# Patient Record
Sex: Male | Born: 1985 | Race: White | Hispanic: No | Marital: Single | Smoking: Current every day smoker
Health system: Southern US, Community
[De-identification: ages and names within clinical notes are randomized; demographics above are authoritative.]

---

## 2007-12-11 ENCOUNTER — Emergency Department (HOSPITAL_COMMUNITY): Admission: EM | Admit: 2007-12-11 | Discharge: 2007-12-11 | Payer: Self-pay | Admitting: Emergency Medicine

## 2008-07-16 ENCOUNTER — Encounter (INDEPENDENT_AMBULATORY_CARE_PROVIDER_SITE_OTHER): Payer: Self-pay | Admitting: Otolaryngology

## 2008-07-16 ENCOUNTER — Ambulatory Visit (HOSPITAL_BASED_OUTPATIENT_CLINIC_OR_DEPARTMENT_OTHER): Admission: RE | Admit: 2008-07-16 | Discharge: 2008-07-16 | Payer: Self-pay | Admitting: Otolaryngology

## 2010-05-03 HISTORY — PX: OTHER SURGICAL HISTORY: SHX169

## 2010-09-15 NOTE — Op Note (Signed)
[name], [name]             ACCOUNT NO.:  [id number]   MEDICAL RECORD NO.:  [id number]          PATIENT TYPE:  AMB   LOCATION:  DSC                          FACILITY:  [facility]   PHYSICIAN:  Christopher E. Ezzard Standing, M.D.DATE OF BIRTH:  10-23-1985   DATE OF PROCEDURE:  07/16/2008  DATE OF DISCHARGE:                               OPERATIVE REPORT   PREOPERATIVE DIAGNOSIS:  Thyroglossal duct cyst.   POSTOPERATIVE DIAGNOSIS:  Thyroglossal duct cyst.   OPERATION:  Sistrunk procedure (excision of thyroglossal duct cyst).   SURGEON:  Kristine Garbe. Ezzard Standing, MD   ANESTHESIA:  General endotracheal.   COMPLICATIONS:  None.   BRIEF CLINICAL NOTE:  Paul Morris is a 25 year old gentleman who has  had recurrent swelling of a cyst in his neck for the past 4-5 months.  On exam, he has a small 2-cm cyst just left of midline about the level  of the anterior thyroid cartilage just below the hyoid bone.  It is  adherent somewhat to the skin as there is some slight inflammation  around the cyst.  Although it is a left of midline, on history and  clinical exam is consistent with probable thyroglossal duct cyst.  He is  taken to operating room at this time for excision of thyroglossal duct  cyst with probable Sistrunk procedure.   DESCRIPTION OF PROCEDURE:  After adequate endotracheal anesthesia, the  patient received 1 gram Ancef IV preoperatively.  Neck was prepped with  Betadine solution and draped out with sterile towels.  Because of the  cyst being adherent to the skin, some of the overlying skin was excised  elliptically with the cyst and the incision was carried more medial  across midline just above the laryngeal cartilage notch.  The incision  was made through the skin down to subcutaneous tissue.  The cyst was  very sclerotic, was dissected out, and there was a small tract coursing  more medial toward the hyoid bone and this was followed out to the  central portion of the hyoid bone.   The hyoid bone was dissected out and  the central portion of the hyoid bone was resected with bone cutters.  A  right angle was then used to the clamp the remaining fistula tract deep  to the hyoid bone and the central hyoid bone and cyst in the fistula  tract was resected and sent to pathology.  A 3-0 suture ligature was  placed around the right angle in addition to another 3-0 silk suture  ligature over the remaining fistula tract.  Hemostasis was obtained with  cautery.  Wound was irrigated with saline and then closed with 3-0  chromic sutures subcutaneously and 5-0 nylon reapproximating the skin  edges.  A 1/4-inch Penrose drain was brought out centrally to drain the  deep excision portion around the hyoid bone.  Bacitracin ointment and  sterile dressing was applied.  Naithan was awoken from anesthesia,  transferred to recovery room, and postop doing well.   DISPOSITION:  Kylian was discharged home later this morning on Keflex 5  mg b.i.d. for 5 days, Tylenol and Vicodin p.r.n.  pain.  I will have him  follow up in my office tomorrow to have his Penrose drain removed and in  1 week to have sutures removed.           ______________________________  Kristine Garbe Ezzard Standing, M.D.     CEN/MEDQ  D:  07/16/2008  T:  07/16/2008  Job:  [id number]

## 2011-01-29 LAB — CBC
Hemoglobin: 16.1
MCHC: 34.5
RBC: 4.9
WBC: 11.9 — ABNORMAL HIGH

## 2011-01-29 LAB — DIFFERENTIAL
Basophils Relative: 0
Lymphocytes Relative: 22
Lymphs Abs: 2.7
Monocytes Absolute: 1
Monocytes Relative: 9
Neutro Abs: 7.8 — ABNORMAL HIGH

## 2011-01-29 LAB — POCT RAPID STREP A: Streptococcus, Group A Screen (Direct): NEGATIVE

## 2015-06-16 ENCOUNTER — Emergency Department (HOSPITAL_COMMUNITY): Payer: Self-pay

## 2015-06-16 ENCOUNTER — Encounter (HOSPITAL_COMMUNITY): Payer: Self-pay | Admitting: Emergency Medicine

## 2015-06-16 ENCOUNTER — Emergency Department (HOSPITAL_COMMUNITY)
Admission: EM | Admit: 2015-06-16 | Discharge: 2015-06-17 | Disposition: A | Discharge status: Home / Self Care | Payer: Self-pay | Attending: Emergency Medicine | Admitting: Emergency Medicine

## 2015-06-16 DIAGNOSIS — Z880 Allergy status to penicillin: Secondary | ICD-10-CM | POA: Insufficient documentation

## 2015-06-16 DIAGNOSIS — F1010 Alcohol abuse, uncomplicated: Secondary | ICD-10-CM | POA: Insufficient documentation

## 2015-06-16 DIAGNOSIS — F17210 Nicotine dependence, cigarettes, uncomplicated: Secondary | ICD-10-CM | POA: Insufficient documentation

## 2015-06-16 DIAGNOSIS — F329 Major depressive disorder, single episode, unspecified: Secondary | ICD-10-CM | POA: Insufficient documentation

## 2015-06-16 DIAGNOSIS — R443 Hallucinations, unspecified: Secondary | ICD-10-CM | POA: Insufficient documentation

## 2015-06-16 LAB — COMPREHENSIVE METABOLIC PANEL
ALBUMIN: 4.8 g/dL (ref 3.5–5.0)
ALK PHOS: 63 U/L (ref 38–126)
ALT: 20 U/L (ref 17–63)
AST: 31 U/L (ref 15–41)
Anion gap: 12 (ref 5–15)
BILIRUBIN TOTAL: 1 mg/dL (ref 0.3–1.2)
BUN: 13 mg/dL (ref 6–20)
CALCIUM: 9.7 mg/dL (ref 8.9–10.3)
CO2: 26 mmol/L (ref 22–32)
Chloride: 100 mmol/L — ABNORMAL LOW (ref 101–111)
Creatinine, Ser: 0.98 mg/dL (ref 0.61–1.24)
GFR calc Af Amer: >60 mL/min (ref >60)
GFR calc non Af Amer: >60 mL/min (ref >60)
GLUCOSE: 100 mg/dL — AB (ref 65–99)
Potassium: 3.6 mmol/L (ref 3.5–5.1)
Sodium: 138 mmol/L (ref 135–145)
TOTAL PROTEIN: 7.8 g/dL (ref 6.5–8.1)

## 2015-06-16 LAB — CBC
HEMATOCRIT: 42.6 % (ref 39.0–52.0)
Hemoglobin: 15.2 g/dL (ref 13.0–17.0)
MCH: 33.6 pg (ref 26.0–34.0)
MCHC: 35.7 g/dL (ref 30.0–36.0)
MCV: 94.2 fL (ref 78.0–100.0)
Platelets: 210 10*3/uL (ref 150–400)
RBC: 4.52 MIL/uL (ref 4.22–5.81)
RDW: 12.8 % (ref 11.5–15.5)
WBC: 13 10*3/uL — ABNORMAL HIGH (ref 4.0–10.5)

## 2015-06-16 LAB — SALICYLATE LEVEL: Salicylate Lvl: <4 mg/dL (ref 2.8–30.0)

## 2015-06-16 LAB — ETHANOL: Alcohol, Ethyl (B): <5 mg/dL (ref <5)

## 2015-06-16 LAB — ACETAMINOPHEN LEVEL: Acetaminophen (Tylenol), Serum: <10 ug/mL — ABNORMAL LOW (ref 10–30)

## 2015-06-16 MED ORDER — THIAMINE HCL 100 MG/ML IJ SOLN: 100.0000 mg | Freq: Every day | INTRAMUSCULAR | Status: DC

## 2015-06-16 MED ORDER — LORAZEPAM 1 MG PO TABS: 0.0000 mg | ORAL_TABLET | Freq: Two times a day (BID) | ORAL | Status: DC

## 2015-06-16 MED ORDER — LORAZEPAM 1 MG PO TABS
0.0000 mg | ORAL_TABLET | Freq: Four times a day (QID) | ORAL | Status: DC
Start: 2015-06-17 — End: 2015-06-17

## 2015-06-16 MED ORDER — VITAMIN B-1 100 MG PO TABS: 100.0000 mg | ORAL_TABLET | Freq: Every day | ORAL | Status: DC

## 2015-06-16 MED ORDER — IBUPROFEN 200 MG PO TABS: 600.0000 mg | ORAL_TABLET | Freq: Three times a day (TID) | ORAL | Status: DC | PRN

## 2015-06-16 MED ORDER — ONDANSETRON HCL 4 MG PO TABS: 4.0000 mg | ORAL_TABLET | Freq: Three times a day (TID) | ORAL | Status: DC | PRN

## 2015-06-16 MED ORDER — ACETAMINOPHEN 325 MG PO TABS: 650.0000 mg | ORAL_TABLET | ORAL | Status: DC | PRN

## 2015-06-16 NOTE — BHH Counselor (Signed)
Spoke with patient to provide him with outpatient resources for substance abuse and mental health. This writer instructed the patient to go to [facility] in the morning during walk-in times to have an assessment. Patient was also encouraged to walk-in to [facility] for an assessment for substance abuse treatment once seen at [facility]. Patient states that he feels safe discharging and will follow-up in the morning. Patient asked if he needed to have insurance and was informed that no insurance was needed for those places. Patient denies any other questions or concerns at this time.     Davina Poke, LCSW Therapeutic Triage Specialist [facility] 06/16/2015 11:20 PM

## 2015-06-16 NOTE — Discharge Instructions (Addendum)
Patient discharged with outpatient substance abuse resources to follow up upon discharge: Intensive Outpatient Programs [facility]    [facility] [address]     [address] [place],  [place]     [place], [place] [phone]      [phone] Both a day and evening program   *Accepts Medicaid  [facility]  [facility] Substance abuse treatment ctr [address]                 [address] [place], [place] [address] [phone]                  [phone]  [facility]    [facility] - Intensive Outpatient [address]                 [address] [place], [place] [address]                 [place], [place]  [phone]                  [phone] [address] [place], [place] [address] [phone] *Accepts MCD      Residential Treatment Programs ASAP Residential Treatment    [facility] ([facility] [address]     [address] [place], [place] [phone]      [phone] or [phone]  [facility]     The [address] (Several in [place]) [address], [place] [address]    [address] [place] [address]     [place], [place] [phone]      [phone]  [facility]   [facility] ([facility] [address]     [address] [place], [place] [address]                  [place], [place] [phone]                   [phone] Admissions: 8am-3pm M-F  Self-Help/Support Groups [facility]   Narcotics Anonymous (NA) Variety of support groups    [facility] [phone] (call for more info)    [address] [place] [address]        [place] - 2 meetings at this location

## 2015-06-16 NOTE — BH Assessment (Signed)
Assessment completed.  Consulted with Donell Sievert, PA-C who states that patient does not meet inpatient criteria and will be discharged with outpatient resources for substance abuse.   Davina Poke, LCSW Therapeutic Triage Specialist [facility] 06/16/2015 11:04 PM

## 2015-06-16 NOTE — ED Notes (Signed)
Pt c/o auditory and visual hallucinations x several years, worse over past few weeks. Hallucinations involve people who converse with him. Denies SI/HI. Pt drinks alcohol heavily, drinks as much as a fifth of liquor in a day, last drink yesterday. Denies any withdrawal sx when he goes without drinking, states he regularly goes stretches of time without drinking, including a period in the past 6 months, and did not have difficulty.
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
## 2015-11-11 ENCOUNTER — Encounter (INDEPENDENT_AMBULATORY_CARE_PROVIDER_SITE_OTHER): Payer: Self-pay

## 2015-11-11 DIAGNOSIS — J029 Acute pharyngitis, unspecified: Secondary | ICD-10-CM

## 2015-11-11 DIAGNOSIS — J069 Acute upper respiratory infection, unspecified: Secondary | ICD-10-CM

## 2016-08-03 NOTE — Progress Notes (Signed)
Subjective:   Patient ID: Paul Morris    DOB: 1986/03/04, 31 y.o. male   MRN: [id number]  CC: "bump on my neck"  HPI: Paul Morris is a 31 y.o. male who presents to clinic today to establish as a new patient. Problems discussed today are as follows:  Neck cyst: Onset 8 months ago. Gradual onset localized above thyroid. Denies pain or difficulty swallowing, hoarse voice. 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  Alcohol use disorder: Sober 2 months. Issue for 7 years. Says he used to hear voices telling him to drink which he says is from his schizophrenia. Now is remission making efforts to avoid situations that would put him at risk or relapse. Not seeing therapist or support group. Open to seeing AA.  Schizophrenia: Diagnosed and managed with medication by Paul Morris. Says he used to have voices telling him to drink. Intermittently hears voices but not at present. Says attacks feel as if he has a helmet on that blinds him and tells him what to do. Sees Paul Morris PMHNP every 6 weeks.  Smoking: Has 12 pack year history. Smoking 3/4 ppd. Interested in stopping. No previous attempts to stop.  ROS: complete ROS performed, see HPI for pertinent ROS.  PMFSH: Schizophrenia, PTSD, alcohol use disorder, tobacco use disorder, anxity, depression, s/p excision of thyroglossal duct cyst. Smoking status reviewed. Medications reviewed.  Objective:   BP 112/70   Pulse 74   Temp 98.3 F (36.8 C) (Oral)   Ht  (1.88 m)   Wt 183 lb (83 kg)   SpO2 98%   BMI 23.50 kg/m  Vitals and nursing note reviewed.  General: well nourished, well developed, in no acute distress with non-toxic appearance HEENT: normocephalic, atraumatic, moist mucous membranes, uvula midline without tonsillar edema Neck: supple, non-tender anterior centralized firm mobile subcutaneous mass measuring 1.5 cm that does not move upon swallowing CV: regular rate and rhythm without murmurs, rubs,  or gallops, no lower extremity edema Lungs: clear to auscultation bilaterally with normal work of breathing Abdomen: soft, non-tender, non-distended, no masses or organomegaly palpable, normoactive bowel sounds Skin: warm, dry, no rashes or lesions, cap refill < 2 seconds Extremities: warm and well perfused, normal tone  Assessment & Plan:   Cyst of neck Subacute. Has h/o thyroglossal ductal cyst correction. No findings concerning for malignancy.  --Will refer to ENT for further evaluation --Holding on TSH, [place], or further imaging at this point  Schizophrenia (HCC) Chronic. Controlled on Invega. Managed by Paul Morris. No auditory hallucinations at present. Neg depression screen. --Paul Morris monthly --Continue Invega 6 mg QD, Celexa 40 mg QD, Buspar 5 mg TID --Educated on importance of remaining sober --Discussed warning signs to seek emergent medical attention  Tobacco use disorder Chronic. Wants to quit. Has 12 pack years. No prior attempts to quit. --Given 1-800-QUIT-NOW hotline --Instructed to return in 1 month for further discussion --Consider referral to Dr. Raymondo Morris  Alcohol use disorder (HCC) Remission. Sober 2 months. Making efforts to avoid EtOH. No other substance abuse per pt. --Discussed reasons to seek help --Encouraged seeking AA  Orders Placed This Encounter  Procedures  . Ambulatory referral to ENT    Referral Priority:   Elective    Referral Type:   Consultation    Referral Reason:   Specialty Services Required    Requested Specialty:   Otolaryngology    Number of Visits Requested:   1   Meds ordered this encounter  Medications  .  busPIRone (BUSPAR) 5 MG tablet    Sig: Take 5 mg by mouth 3 (three) times daily.  . citalopram (CELEXA) 40 MG tablet    Sig: Take 40 mg by mouth daily.  . paliperidone (INVEGA) 6 MG 24 hr tablet    Sig: Take 6 mg by mouth daily.    Paul Parcel, DO [facility], PGY-1 08/04/2016 6:14 PM

## 2016-08-04 ENCOUNTER — Encounter: Payer: Self-pay | Admitting: Family Medicine

## 2016-08-04 ENCOUNTER — Ambulatory Visit (INDEPENDENT_AMBULATORY_CARE_PROVIDER_SITE_OTHER): Payer: Medicaid Other | Admitting: Family Medicine

## 2016-08-04 VITALS — BP 112/70 | HR 74 | Temp 98.3°F | Ht 74.0 in | Wt 183.0 lb

## 2016-08-04 DIAGNOSIS — F203 Undifferentiated schizophrenia: Secondary | ICD-10-CM

## 2016-08-04 DIAGNOSIS — F32A Depression, unspecified: Secondary | ICD-10-CM | POA: Insufficient documentation

## 2016-08-04 DIAGNOSIS — F17200 Nicotine dependence, unspecified, uncomplicated: Secondary | ICD-10-CM

## 2016-08-04 DIAGNOSIS — R590 Localized enlarged lymph nodes: Secondary | ICD-10-CM | POA: Insufficient documentation

## 2016-08-04 DIAGNOSIS — Z9889 Other specified postprocedural states: Secondary | ICD-10-CM | POA: Diagnosis not present

## 2016-08-04 DIAGNOSIS — IMO0002 Reserved for concepts with insufficient information to code with codable children: Secondary | ICD-10-CM

## 2016-08-04 DIAGNOSIS — F209 Schizophrenia, unspecified: Secondary | ICD-10-CM | POA: Insufficient documentation

## 2016-08-04 DIAGNOSIS — F1020 Alcohol dependence, uncomplicated: Secondary | ICD-10-CM | POA: Insufficient documentation

## 2016-08-04 DIAGNOSIS — Z87798 Personal history of other (corrected) congenital malformations: Secondary | ICD-10-CM | POA: Diagnosis not present

## 2016-08-04 DIAGNOSIS — F1099 Alcohol use, unspecified with unspecified alcohol-induced disorder: Secondary | ICD-10-CM

## 2016-08-04 DIAGNOSIS — F329 Major depressive disorder, single episode, unspecified: Secondary | ICD-10-CM | POA: Insufficient documentation

## 2016-08-04 DIAGNOSIS — F411 Generalized anxiety disorder: Secondary | ICD-10-CM | POA: Insufficient documentation

## 2016-08-04 DIAGNOSIS — L723 Sebaceous cyst: Secondary | ICD-10-CM

## 2016-08-04 NOTE — Assessment & Plan Note (Signed)
Remission. Sober 2 months. Making efforts to avoid EtOH. No other substance abuse per pt. --Discussed reasons to seek help --Encouraged seeking AA

## 2016-08-04 NOTE — Assessment & Plan Note (Signed)
Chronic. Wants to quit. Has 12 pack years. No prior attempts to quit. --Given 1-800-QUIT-NOW hotline --Instructed to return in 1 month for further discussion --Consider referral to Dr. Raymondo Band

## 2016-08-04 NOTE — Assessment & Plan Note (Addendum)
Chronic. Controlled on Invega. Managed by [employer]. No auditory hallucinations at present. Neg depression screen. --[facility] monthly --Continue Invega 6 mg QD, Celexa 40 mg QD, Buspar 5 mg TID --Educated on importance of remaining sober --Discussed warning signs to seek emergent medical attention

## 2016-08-04 NOTE — Patient Instructions (Signed)
Thank you for coming in to see [place] today. Please see below to review our plan for today's visit.  1. I sent a referral in for ENT to evaluate your neck mass. They will contact you to schedule an appointment. I suspect this is a benign cause. 2. Continue [facility] for your medications and follow-up. If you relapse with alcohol abuse, please see medical attention. Consider alcoholic anonymous as this can be very beneficial. 3. I will discuss with you smoking cessation at her next visit. In the meantime he will call 1-800-quit-now. They will go through a series of questions over the course of about 15 minutes and provide you free resources to help stop smoking. 4. Return to clinic in one month.  Please call the clinic at [phone] if your symptoms worsen or you have any concerns. It was my pleasure to see you. -- [name], DO [facility], PGY-1

## 2016-08-04 NOTE — Assessment & Plan Note (Addendum)
Subacute. Has h/o thyroglossal ductal cyst correction. No findings concerning for malignancy.  --Will refer to ENT for further evaluation --Holding on TSH, [place], or further imaging at this point

## 2016-09-03 ENCOUNTER — Other Ambulatory Visit: Payer: Self-pay | Admitting: Otolaryngology

## 2016-09-03 DIAGNOSIS — Q892 Congenital malformations of other endocrine glands: Secondary | ICD-10-CM

## 2016-09-10 ENCOUNTER — Ambulatory Visit
Admission: RE | Admit: 2016-09-10 | Discharge: 2016-09-10 | Disposition: A | Discharge status: Home / Self Care | Payer: Medicaid Other | Source: Ambulatory Visit | Attending: Otolaryngology | Admitting: Otolaryngology

## 2016-09-10 DIAGNOSIS — Q892 Congenital malformations of other endocrine glands: Secondary | ICD-10-CM

## 2016-09-24 ENCOUNTER — Other Ambulatory Visit: Payer: Self-pay | Admitting: Otolaryngology

## 2016-09-28 ENCOUNTER — Encounter: Payer: Self-pay | Admitting: Family Medicine

## 2016-11-09 NOTE — Progress Notes (Signed)
   Subjective:   Patient ID: Paul Morris    DOB: [birth year], 31 y.o. male   MRN: [id number]   CC: "Abdominal pain"  HPI: Paul Morris is a 31 y.o. male who presents to clinic today for abdominal pain. Problems discussed today are as follows:  Abdominal pain: Patient complaining of left upper quadrant abdominal pain since relapse with alcohol abuse with a 3 day binge last week. Patient states he is also been experiencing vomiting with specks of blood. He has attempted to relieve his symptoms with water without success. Abdominal pain seems to be unaffected by food. ROS: Denies fevers or chills, nausea, chest pain, dyspnea, constipation or diarrhea, melena or hematochezia.  EtOH abuse: Recent relapse with 3 day binge. Patient drinking excessive amounts of beer only. Seems to be triggered when exposed to social situations with drinking, particularly his father who lives separated from his mother. [facility]. Next group meeting tomorrow. ROS: Denies visual hallucinations, tremors, palpitations, diaphoresis.  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 Has been followed by ENT since surgery. Recently rescheduled follow-up visit missed yesterday. ROS: Denies dysphagia, edema, fevers or chills.  Complete ROS performed, see HPI for pertinent.  PMFSH: Schizophrenia, PTSD, alcohol use disorder, tobacco use disorder, anxity, depression, s/p excision of thyroglossal duct cyst with revision. Smoking status reviewed. Medications reviewed.  Objective:   BP 128/62   Pulse 83   Temp 98.4 F (36.9 C) (Oral)   Ht 6\' 2"  (1.88 m)   Wt 179 lb (81.2 kg)   SpO2 96%   BMI 22.98 kg/m  Vitals and nursing note reviewed.  General: well nourished, well developed, in no acute distress with non-toxic appearance HEENT: normocephalic, atraumatic, moist mucous membranes Neck: supple, non-tender without lymphadenopathy, central  anterior well-healed scar on neck CV: regular rate and rhythm without murmurs, rubs, or gallops, no lower extremity edema Lungs: clear to auscultation bilaterally with normal work of breathing Abdomen: soft, minimally tender at LUQ without rebound or gaurding, non-distended, no masses or organomegaly palpable, normoactive bowel sounds Skin: warm, dry, no rashes or lesions, cap refill < 2 seconds Extremities: warm and well perfused, normal tone Psych: euthymic mood congruent affect  Assessment & Plan:   History of thyroglossal duct cyst removal Chronic. Resolved s/p revision correction.  --Pt instructed to f/u with ENT  Alcohol use disorder (HCC) Chornic. Recently in relapse as of this week. Triggered when pt around social situations with EtOH, particularly his father. Pt shows good understanding of situation and what he needs to do. --Continue follow-up with [facility] and Invega 6 mg daily, Buspar 5 mg daily, Celexa 40 mg daily  Abdominal pain, left upper quadrant Acute. Pt endorses EtOH binge which is likely the source. POCT H. Pylori negative. No signs of acute abdomen. --Given trial with Protonix 40 mg daily --Discussed importance of remaining sober  Orders Placed This Encounter  Procedures  . H. pylori Screen   Meds ordered this encounter  Medications  . pantoprazole (PROTONIX) 40 MG tablet    Sig: Take 1 tablet (40 mg total) by mouth daily.    Dispense:  30 tablet    Refill:  0    Durward Parcelavid McMullen, DO [facility], PGY-2 11/10/2016 8:29 PM

## 2016-11-10 ENCOUNTER — Encounter: Payer: Self-pay | Admitting: Family Medicine

## 2016-11-10 ENCOUNTER — Ambulatory Visit (INDEPENDENT_AMBULATORY_CARE_PROVIDER_SITE_OTHER): Payer: Medicaid Other | Admitting: Family Medicine

## 2016-11-10 VITALS — BP 128/62 | HR 83 | Temp 98.4°F | Ht 74.0 in | Wt 179.0 lb

## 2016-11-10 DIAGNOSIS — R1012 Left upper quadrant pain: Secondary | ICD-10-CM

## 2016-11-10 DIAGNOSIS — Z9889 Other specified postprocedural states: Secondary | ICD-10-CM | POA: Diagnosis not present

## 2016-11-10 DIAGNOSIS — K219 Gastro-esophageal reflux disease without esophagitis: Secondary | ICD-10-CM | POA: Insufficient documentation

## 2016-11-10 DIAGNOSIS — R1010 Upper abdominal pain, unspecified: Secondary | ICD-10-CM

## 2016-11-10 DIAGNOSIS — F1099 Alcohol use, unspecified with unspecified alcohol-induced disorder: Secondary | ICD-10-CM

## 2016-11-10 DIAGNOSIS — IMO0002 Reserved for concepts with insufficient information to code with codable children: Secondary | ICD-10-CM

## 2016-11-10 DIAGNOSIS — Z87798 Personal history of other (corrected) congenital malformations: Secondary | ICD-10-CM | POA: Diagnosis not present

## 2016-11-10 LAB — POCT H PYLORI SCREEN: H PYLORI SCREEN, POC: NEGATIVE

## 2016-11-10 MED ORDER — PANTOPRAZOLE SODIUM 40 MG PO TBEC: 40.0000 mg | DELAYED_RELEASE_TABLET | Freq: Every day | ORAL | 0 refills | Status: DC

## 2016-11-10 NOTE — Assessment & Plan Note (Addendum)
Chornic. Recently in relapse as of this week. Triggered when pt around social situations with EtOH, particularly his father. Pt shows good understanding of situation and what he needs to do. --Continue follow-up with [facility] and Invega 6 mg daily, Buspar 5 mg daily, Celexa 40 mg daily

## 2016-11-10 NOTE — Patient Instructions (Addendum)
Thank you for coming in to see [place] today. Please see below to review our plan for today's visit.  1. I have given you a prescription for Protonix. See below for information on this medication. He likely have irritation of the stomach from your alcohol abuse. Ultimately this will not resolve if you continue to drink. Please continue following your group therapy sessions and abstain from social situations where alcohol is involved. 2. Follow-up with ENT for your surgical follow-up.  Return to clinic in 2 weeks to discuss your abdominal pain.  Please call the clinic at [phone] if your symptoms worsen or you have any concerns. It was my pleasure to see you. -- [name], DO [facility], PGY-2  Pantoprazole tablets What is this medicine? PANTOPRAZOLE (pan TOE pra zole) prevents the production of acid in the stomach. It is used to treat gastroesophageal reflux disease (GERD), inflammation of the esophagus, and Zollinger-Ellison syndrome. This medicine may be used for other purposes; ask your health care provider or pharmacist if you have questions. COMMON BRAND NAME(S): Protonix What should I tell my health care provider before I take this medicine? They need to know if you have any of these conditions: -liver disease -low levels of magnesium in the blood -lupus -an unusual or allergic reaction to omeprazole, lansoprazole, pantoprazole, rabeprazole, other medicines, foods, dyes, or preservatives -pregnant or trying to get pregnant -breast-feeding How should I use this medicine? Take this medicine by mouth. Swallow the tablets whole with a drink of water. Follow the directions on the prescription label. Do not crush, break, or chew. Take your medicine at regular intervals. Do not take your medicine more often than directed. Talk to your pediatrician regarding the use of this medicine in children. While this drug may be prescribed for children as young as 5 years for  selected conditions, precautions do apply. Overdosage: If you think you have taken too much of this medicine contact a poison control center or emergency room at once. NOTE: This medicine is only for you. Do not share this medicine with others. What if I miss a dose? If you miss a dose, take it as soon as you can. If it is almost time for your next dose, take only that dose. Do not take double or extra doses. What may interact with this medicine? Do not take this medicine with any of the following medications: -atazanavir -nelfinavir This medicine may also interact with the following medications: -ampicillin -delavirdine -erlotinib -iron salts -medicines for fungal infections like ketoconazole, itraconazole and voriconazole -methotrexate -mycophenolate mofetil -warfarin This list may not describe all possible interactions. Give your health care provider a list of all the medicines, herbs, non-prescription drugs, or dietary supplements you use. Also tell them if you smoke, drink alcohol, or use illegal drugs. Some items may interact with your medicine. What should I watch for while using this medicine? It can take several days before your stomach pain gets better. Check with your doctor or health care professional if your condition does not start to get better, or if it gets worse. You may need blood work done while you are taking this medicine. What side effects may I notice from receiving this medicine? Side effects that you should report to your doctor or health care professional as soon as possible: -allergic reactions like skin rash, itching or hives, swelling of the face, lips, or tongue -bone, muscle or joint pain -breathing problems -chest pain or chest tightness -dark yellow or brown  urine -dizziness -fast, irregular heartbeat -feeling faint or lightheaded -fever or sore throat -muscle spasm -palpitations -rash on cheeks or arms that gets worse in the sun -redness,  blistering, peeling or loosening of the skin, including inside the mouth -seizures -tremors -unusual bleeding or bruising -unusually weak or tired -yellowing of the eyes or skin Side effects that usually do not require medical attention (report to your doctor or health care professional if they continue or are bothersome): -constipation -diarrhea -dry mouth -headache -nausea This list may not describe all possible side effects. Call your doctor for medical advice about side effects. You may report side effects to FDA at 1-800-FDA-1088. Where should I keep my medicine? Keep out of the reach of children. Store at room temperature between 15 and 30 degrees C (59 and 86 degrees F). Protect from light and moisture. Throw away any unused medicine after the expiration date. NOTE: This sheet is a summary. It may not cover all possible information. If you have questions about this medicine, talk to your doctor, pharmacist, or health care provider.  2018 Elsevier/Gold Standard (2015-05-22 12:20:19)

## 2016-11-10 NOTE — Assessment & Plan Note (Addendum)
Chronic. Resolved s/p revision correction.  --Pt instructed to f/u with ENT

## 2016-11-10 NOTE — Assessment & Plan Note (Addendum)
Acute. Pt endorses EtOH binge which is likely the source. POCT H. Pylori negative. No signs of acute abdomen. --Given trial with Protonix 40 mg daily --Discussed importance of remaining sober

## 2017-02-03 ENCOUNTER — Ambulatory Visit (INDEPENDENT_AMBULATORY_CARE_PROVIDER_SITE_OTHER): Payer: Medicaid Other | Admitting: Family Medicine

## 2017-02-03 ENCOUNTER — Ambulatory Visit (HOSPITAL_COMMUNITY)
Admission: RE | Admit: 2017-02-03 | Discharge: 2017-02-03 | Disposition: A | Discharge status: Home / Self Care | Payer: Medicaid Other | Source: Ambulatory Visit | Attending: Family Medicine | Admitting: Family Medicine

## 2017-02-03 ENCOUNTER — Encounter: Payer: Self-pay | Admitting: Family Medicine

## 2017-02-03 VITALS — BP 130/64 | HR 84 | Temp 97.9°F | Ht 74.0 in | Wt 182.8 lb

## 2017-02-03 DIAGNOSIS — R002 Palpitations: Secondary | ICD-10-CM | POA: Diagnosis not present

## 2017-02-03 DIAGNOSIS — Z23 Encounter for immunization: Secondary | ICD-10-CM | POA: Diagnosis not present

## 2017-02-03 DIAGNOSIS — K219 Gastro-esophageal reflux disease without esophagitis: Secondary | ICD-10-CM | POA: Diagnosis present

## 2017-02-03 DIAGNOSIS — F17200 Nicotine dependence, unspecified, uncomplicated: Secondary | ICD-10-CM | POA: Diagnosis not present

## 2017-02-03 MED ORDER — PANTOPRAZOLE SODIUM 20 MG PO TBEC: 20.0000 mg | DELAYED_RELEASE_TABLET | Freq: Every day | ORAL | 0 refills | Status: DC

## 2017-02-03 NOTE — Assessment & Plan Note (Addendum)
Chronic. Resolved but never evaluated for. Recently self-discontinued Buspar. Likely secondary to GAD and exacerbated by EtOH use. Resolved due to being sober for 2 weeks. EKG unremarkable today. Followed by [facility] monthly for GAD treatment. --Recommend discussing restarting Buspar with [facility] at next visit --Continue Celexa 40 mg daily and Invega 6 mg daily

## 2017-02-03 NOTE — Patient Instructions (Signed)
Thank you for coming in to see [place] today. Please see below to review our plan for today's visit.  1. Her EKG overall looks normal. He can consider restarting her BuSpar if you would like. I would advise you to discuss this with [facility]. 2. I have given you a refill prescription for Protonix at a lower dose which should be adequate to control reflux if this recurs. This should not be used as a prophylaxis to drinking. Ultimately, remaining sober is the only way to help prevent her abdominal pain from recurring. 3. Linear no near interested in smoking cessation. In the meantime being call 1-800-QUIT-NOW. You can schedule a complete physical exam up front when you leave.  Please call the clinic at [phone] if your symptoms worsen or you have any concerns. It was my pleasure to see you. -- [name], DO [facility], PGY-2

## 2017-02-03 NOTE — Assessment & Plan Note (Addendum)
Chronic. Current every day smoker. No history of cessation attempts. Not interested today. --Given contact information for 1-800-QUIT-NOW

## 2017-02-03 NOTE — Progress Notes (Signed)
Subjective:   Patient ID: Paul Morris    DOB: 10-Jul-1985, 31 y.o. male   MRN: [id number]  CC: "Abdominal pain"  HPI: Paul Morris is a 30 y.o. male who presents to clinic today for abdominal pain. Problems discussed today are as follows:  Abdominal pain: Patient started Protonix prescribed with significant relief after a few days. Patient then had a relapse in alcohol use because his symptoms had resolved. He did not continue the Protonix at this time but began having recurrent abdominal pain. He states "it feels like something is sloshing around in there." He has been sober for approximately 2 weeks with resolution of symptoms. He does not have any more of his Protonix. ROS: Denies nausea or vomiting, hemoptysis, melena or hematochezia, diarrhea constipation.  Palpitations: Was experiencing almost daily proximally one month ago. Patient states he does not have a history of these feelings but is treated with BuSpar for his anxiety. He states his doctor at [facility] told him he should get an EKG. Patient discontinued the medication taking it was causing the palpitations with resolution of symptoms. He states his anxiety is unchanged since discontinuing BuSpar. ROS: Denies feelings of syncope, chest pain, shortness of breath, headaches.  Smoking: Continues to smoke 1 pack per day. Denies any previous history of attempts to stop. States his mother who he lives with his against him trying Chantix. Patient states this is because she is a [employer] and thinks the medication is "addicting." Patient is in disagreement with his mother but is not interested in cessation today. ROS: Denies fevers or chills, cough, chest pain, shortness of breath, change in weight.  Complete ROS performed, see HPI for pertinent.  PMFSH: Schizophrenia, PTSD, alcohol use disorder, tobacco use disorder, anxity, depression, s/p excision of thyroglossal duct cyst with revision. Smoking status reviewed. Medications  reviewed.  Objective:   BP 130/64   Pulse 84   Temp 97.9 F (36.6 C) (Oral)   Ht  (1.88 m)   Wt 182 lb 12.8 oz (82.9 kg)   SpO2 97%   BMI 23.47 kg/m  Vitals and nursing note reviewed.  General: well nourished, well developed, in no acute distress with non-toxic appearance HEENT: normocephalic, atraumatic, moist mucous membranes Neck: supple, non-tender without lymphadenopathy CV: regular rate and rhythm without murmurs, rubs, or gallops, no lower extremity edema Lungs: clear to auscultation bilaterally with normal work of breathing Abdomen: soft, non-tender, non-distended, no masses or organomegaly palpable, normoactive bowel sounds Skin: warm, dry, no rashes or lesions, cap refill < 2 seconds Extremities: warm and well perfused, normal tone  Assessment & Plan:   Palpitations Chronic. Resolved but never evaluated for. Recently self-discontinued Buspar. Likely secondary to GAD and exacerbated by EtOH use. Resolved due to being sober for 2 weeks. EKG unremarkable today. Followed by [facility] monthly for GAD treatment. --Recommend discussing restarting Buspar with [facility] at next visit --Continue Celexa 40 mg daily and Invega 6 mg daily  Tobacco use disorder Chronic. Current every day smoker. No history of cessation attempts. Not interested today. --Given contact information for 1-800-QUIT-NOW  GERD (gastroesophageal reflux disease) Chronic. Stable. Seems to be related to EtOH abuse. Currently sober for 2 weeks. No red flags. --Discussed importance of EtOH avoidance --Given refill for Protonix 20 mg daily with strict instructions not to take as prophylaxis with EtOH  Orders Placed This Encounter  Procedures  . Flu Vaccine QUAD 36+ mos IM  . EKG 12-Lead   Meds ordered this encounter  Medications  .  pantoprazole (PROTONIX) 20 MG tablet    Sig: Take 1 tablet (20 mg total) by mouth daily.    Dispense:  30 tablet    Refill:  0    [name], DO [facility], PGY-2 02/04/2017 10:29 AM

## 2017-02-03 NOTE — Assessment & Plan Note (Addendum)
Chronic. Stable. Seems to be related to EtOH abuse. Currently sober for 2 weeks. No red flags. --Discussed importance of EtOH avoidance --Given refill for Protonix 20 mg daily with strict instructions not to take as prophylaxis with EtOH

## 2017-03-22 ENCOUNTER — Encounter: Payer: Self-pay | Admitting: Family Medicine

## 2017-03-22 ENCOUNTER — Ambulatory Visit: Payer: Medicaid Other | Admitting: Family Medicine

## 2017-03-22 ENCOUNTER — Other Ambulatory Visit: Payer: Self-pay

## 2017-03-22 VITALS — BP 100/62 | HR 74 | Temp 97.7°F | Ht 74.0 in | Wt 187.0 lb

## 2017-03-22 DIAGNOSIS — F411 Generalized anxiety disorder: Secondary | ICD-10-CM | POA: Diagnosis not present

## 2017-03-22 DIAGNOSIS — K122 Cellulitis and abscess of mouth: Secondary | ICD-10-CM | POA: Diagnosis present

## 2017-03-22 DIAGNOSIS — F17200 Nicotine dependence, unspecified, uncomplicated: Secondary | ICD-10-CM

## 2017-03-22 NOTE — Assessment & Plan Note (Addendum)
Acute.  Likely related to trauma by finger while manipulating tonsil stones.  No signs of secondary bacterial infection.  No red flags.  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 - Provided patient with ENT contact information and requested he notify them of his situation - Discussed precautions when to seek emergent medical care - Recommended OTC NSAIDs, frequent hydration with water, and brushing teeth for 2 minutes twice daily, and inserting fingers into pharynx - RTC 2 weeks

## 2017-03-22 NOTE — Assessment & Plan Note (Addendum)
Chronic.  Improving without use of BuSpar.  [facility]. - Requested patient schedule follow-up with [facility] for anxiety - Recommended patient restart BuSpar if symptoms seem to worsen

## 2017-03-22 NOTE — Progress Notes (Signed)
   Subjective   Patient ID: Paul Morris    DOB: [birth year], 31 y.o. male   MRN: [id number]  CC: "Bump in throat"  HPI: Paul Morris is a 31 y.o. male who presents to clinic today for the following:  Swollen uvula: Onset 1 month ago after patient dislodged several tonsil stones manually.  Patient began to gradually experience and on sensation in his throat especially when eating.  He denies history of previous sensation in throat.  He denies he was or chills, dysphagia with solids or liquids, nausea or vomiting, shortness of breath, hoarseness, cough, sore throat.  He has remained sober for the past 4 weeks.  He does feel like he may have aggravated his uvula while removing the stones.  He continues to smoke.  He has tried vinegar gargles to help with the tonsil stones but is otherwise not tried additional medications.  Anxiety: Patient has not restarted BuSpar as instructed since last visit.  He feels that his anxiety is improving despite not taking his medication.  Patient states he should be scheduling a follow-up appointment [facility] soon.  Smoking: Patient continues to smoke approximately 3/4 packs/day.  He is not currently interested in cessation at this time.  ROS: see HPI for pertinent.  PMFSH: Schizophrenia, PTSD, alcohol use disorder, tobacco use disorder, anxity, depression.  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  Family history polysubstance abuse.  Smoking status reviewed. Medications reviewed.  Objective   BP 100/62   Pulse 74   Temp 97.7 F (36.5 C) (Oral)   Ht 6\' 2"  (1.88 m)   Wt 187 lb (84.8 kg)   SpO2 97%   BMI 24.01 kg/m  Vitals and nursing note reviewed.  General: well nourished, well developed, NAD with non-toxic appearance HEENT: normocephalic, atraumatic, moist mucous membranes, uvula midline with slight erythema and associated distal irregular enlargement without purulence, tonsils nonedematous bilaterally without  purulence, erythema, or stones Neck: supple, non-tender without lymphadenopathy Cardiovascular: regular rate and rhythm without murmurs, rubs, or gallops Lungs: clear to auscultation bilaterally with normal work of breathing  Assessment & Plan   Uvulitis Acute.  Likely related to trauma by finger while manipulating tonsil stones.  No signs of secondary bacterial infection.  No red flags.  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 - Provided patient with ENT contact information and requested he notify them of his situation - Discussed precautions when to seek emergent medical care - Recommended OTC NSAIDs, frequent hydration with water, and brushing teeth for 2 minutes twice daily, and inserting fingers into pharynx - RTC 2 weeks  Generalized anxiety disorder Chronic.  Improving without use of BuSpar.  Managed by [facility]. - Requested patient schedule follow-up with [facility] for anxiety - Recommended patient restart BuSpar if symptoms seem to worsen  Tobacco use disorder Chronic.  Continues to smoke 3/4 packs/day.  Not interested in sedation. - Emphasized importance of smoking cessation  No orders of the defined types were placed in this encounter.  No orders of the defined types were placed in this encounter.   Durward Parcelavid Cassidey Barrales, DO [facility], PGY-2 03/22/2017, 2:45 PM

## 2017-03-22 NOTE — Patient Instructions (Addendum)
Thank you for coming in to see us today. Please see below to review our plan for today's visit.  1.  Do not insert her fingers into your throat as this could aggravate your uvula more and introduce infection.  If you develop fevers or chills, worsening swelling or uvula, hoarseness of voice, or difficulty eating or drinking, seek immediate medical care. 2.  Please call Dr. Ezzard StandingNewman at [phone] and notify the group of your situation. 3.  Follow[facility]up with [facility] for your anxiety.  He can restart your BuSpar if you feel like it is getting worse in the meantime. 4.  It is important that you continue to remain sober but also important that you stop smoking to help prevent lung disease and throat cancer. 5.  Like you to return to the clinic in 2 weeks to recheck.  Please call the clinic at [phone] if your symptoms worsen or you have any concerns. It was our pleasure to serve you.  Durward Parcelavid Ziomara Birenbaum, DO [facility], PGY-2

## 2017-03-22 NOTE — Assessment & Plan Note (Addendum)
Chronic.  Continues to smoke 3/4 packs/day.  Not interested in sedation. - Emphasized importance of smoking cessation

## 2017-04-05 ENCOUNTER — Encounter: Payer: Self-pay | Admitting: Family Medicine

## 2017-04-05 ENCOUNTER — Ambulatory Visit: Payer: Medicaid Other | Admitting: Family Medicine

## 2017-04-05 ENCOUNTER — Other Ambulatory Visit: Payer: Self-pay

## 2017-04-05 VITALS — BP 100/60 | HR 80 | Temp 98.1°F | Ht 74.0 in | Wt 184.4 lb

## 2017-04-05 DIAGNOSIS — Z114 Encounter for screening for human immunodeficiency virus [HIV]: Secondary | ICD-10-CM

## 2017-04-05 DIAGNOSIS — Z23 Encounter for immunization: Secondary | ICD-10-CM

## 2017-04-05 DIAGNOSIS — F1020 Alcohol dependence, uncomplicated: Secondary | ICD-10-CM

## 2017-04-05 DIAGNOSIS — K122 Cellulitis and abscess of mouth: Secondary | ICD-10-CM

## 2017-04-05 DIAGNOSIS — F17200 Nicotine dependence, unspecified, uncomplicated: Secondary | ICD-10-CM

## 2017-04-05 NOTE — Assessment & Plan Note (Addendum)
Chronic. Patient smokes 3/4 ppd. Not interested in cessation today. -- Discussed importance of cessation and risk of lung and throat cancer

## 2017-04-05 NOTE — Progress Notes (Signed)
   Subjective   Patient ID: Paul Morris    DOB: 16-Dec-1985, 31 y.o. male   MRN: [id number]  CC: "Follow-up for uvula swelling"  HPI: Paul Morris is a 31 y.o. male who presents to clinic today for the following:  Uvulitis: The patient here today for follow-up due to uvula swelling.  Onset end of 01/2017 with asymmetrical swelling at the distal tip of uvula.  He initially endorsed manual dislodgment of tonsil stones.  Patient denies manipulation of uvula removal tonsil stones since his last appointment 1 month ago.  He failed to contact his ENT.  Patient feels his swelling is unchanged.  Smoking: Patient continues to smoke 3/4 packs/day.  He is currently not interested in cessation.  Alcohol abuse: Patient recently went into relapse over the weekend after having 18 beers over the course of 2 days with his father.  Patient endorses one episode of vomiting the morning after the symptoms resolved quickly.  ROS: see HPI for pertinent.  PMFSH: Schizophrenia, PTSD, alcohol use disorder, tobacco use disorder, anxity, depression.  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  Family history polysubstance abuse. Smoking status reviewed. Medications reviewed.  Objective   BP 100/60 (BP Location: Left Arm, Patient Position: Sitting, Cuff Size: Normal)   Pulse 80   Temp 98.1 F (36.7 C) (Oral)   Ht 6\' 2"  (1.88 m)   Wt 184 lb 6.4 oz (83.6 kg)   SpO2 96%   BMI 23.68 kg/m  Vitals and nursing note reviewed.  General: well nourished, well developed, NAD with non-toxic appearance HEENT: normocephalic, atraumatic, moist mucous membranes, uvula midline with slight erythema and distal irregular edema of uvula without purulence, tonsils nonedematous Neck: supple, non-tender without lymphadenopathy Cardiovascular: regular rate and rhythm without murmurs, rubs, or gallops Lungs: clear to auscultation bilaterally with normal work of breathing Skin: warm, dry, no rashes or  lesions, cap refill < 2 seconds Extremities: warm and well perfused, normal tone, no edema Psych: euthymic mood, congruent affect, non-tremulous  Assessment & Plan   Alcohol use disorder, severe, dependence (HCC) Chronic. Recent replace over weekend. Seems to be triggered when patient is around father. No signs of active withdrawal. -- Advised patient to contact [facility] to schedule f/u  Tobacco use disorder Chronic. Patient smokes 3/4 ppd. Not interested in cessation today. -- Discussed importance of cessation and risk of lung and throat cancer  Uvulitis Subacute. Likely from trauma due to H/O tonsillar stones. No signs of bacterial infection, retropharyngeal or peritonsillar abscess. -- Advised patient to contact ENT to f/u persistent swelling, given contact information  Orders Placed This Encounter  Procedures  . Tdap vaccine greater than or equal to 7yo IM  . HIV antibody (with reflex)   No orders of the defined types were placed in this encounter.   Durward Parcelavid McMullen, DO [facility], PGY-2 04/06/2017, 4:27 PM

## 2017-04-05 NOTE — Patient Instructions (Addendum)
Thank you for coming in to see us today. Please see below to review our plan for today's visit.  1.  Do not insert her fingers into your throat as this could aggravate your uvula more and introduce infection.  If you develop fevers or chills, worsening swelling or uvula, hoarseness of voice, or difficulty eating or drinking, seek immediate medical care. 2.  Please call Dr. Ezzard StandingNewman at [phone] and notify the group of your situation and schedule follow up. 3.  [facility]. 4.  It is important that you continue to remain sober but also important that you stop smoking to help prevent lung disease and throat cancer. 5.  I will call you if your HIV screen is abnormal.  You are also up-to-date on your tetanus booster. 6.  I would like you to return to the clinic in 1 month to recheck.  Please call the clinic at [phone] if your symptoms worsen or you have any concerns. It was our pleasure to serve you.  Durward Parcelavid McMullen, DO [facility], PGY-2

## 2017-04-05 NOTE — Assessment & Plan Note (Addendum)
Subacute. Likely from trauma due to H/O tonsillar stones. No signs of bacterial infection, retropharyngeal or peritonsillar abscess. -- Advised patient to contact ENT to f/u persistent swelling, given contact information

## 2017-04-05 NOTE — Assessment & Plan Note (Addendum)
Chronic. Recent replace over weekend. Seems to be triggered when patient is around father. No signs of active withdrawal. -- Advised patient to contact [facility] to schedule f/u

## 2017-04-06 ENCOUNTER — Encounter: Payer: Self-pay | Admitting: Family Medicine

## 2017-04-06 LAB — HIV ANTIBODY (ROUTINE TESTING W REFLEX): HIV SCREEN 4TH GENERATION: NONREACTIVE

## 2017-05-11 NOTE — Progress Notes (Signed)
   Subjective   Patient ID: Paul Morris    DOB: 1986-04-19, 32 y.o. male   MRN: [id number]  CC: "Follow-up"  HPI: Paul Morris is a 32 y.o. male who presents to clinic today for the following:  Uvulitis: Patient states that he was evaluated by the ENT surgeon a few weeks ago.  Patient was told symptoms were not consistent with cancer or HPV.  ENT suspected GERD may be contributing to his uvula irregularity prescribed him a medication for reflux.  Patient is unsure what the medication is but states his symptoms have improved.  Continues to have unchanged irregular swelling of his uvula but has not grown in size since his last evaluation.   Smoking: Patient continues to smoke.  Cessation at this time.  He intends to discuss this with his mother who also smokes about possibly quitting over the next year.  His mother recently lost his job and money is tight for the family.  Alcohol abuse: Patient recently went on family vacation last month and drink excessively.  His last drink was 2 days ago.  [facility] and was in a group session earlier today.  Patient is to follow-up tomorrow [facility] for medication follow-up.  He denies feelings of depression, visual or auditory hallucinations.  ROS: see HPI for pertinent.  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 Smoking status reviewed. Medications reviewed.  Objective   BP 134/62   Pulse 80   Temp 98.2 F (36.8 C) (Oral)   Ht 6\' 2"  (1.88 m)   Wt 186 lb 6.4 oz (84.6 kg)   SpO2 96%   BMI 23.93 kg/m  Vitals and nursing note reviewed.  General: disheveled appearance, NAD with non-toxic appearance HEENT: normocephalic, atraumatic, moist mucous membranes, irregular uvula and distal end without erythema Neck: supple, non-tender without lymphadenopathy Cardiovascular:  regular rate and rhythm without murmurs, rubs, or gallops Lungs: clear to auscultation bilaterally with normal work of breathing Skin: warm, dry, no rashes or lesions, cap refill < 2 seconds Extremities: warm and well perfused, normal tone, no edema  Assessment & Plan   Alcohol use disorder, severe, dependence (HCC) Chronic.  Patient continues to be in relapse.  Trigger seems to be when patient is around father.  [facility] for [facility] management and therapy.  Has scheduled follow-up tomorrow. - Ad[facility] - Discussed importance of abstinence from alcohol due to history and long-term sequelae along with primary issue for today's visit  Tobacco use disorder Chronic.  Current everyday smoker.  Continues to smoke 3/4 packs/day.  Not interested in cessation.  Likely contributing to reflux. - Discussed smoking cessation  Uvulitis Subacute.  Was evaluated by ENT.  Consistent with irritated uvula likely from uncontrolled gastric reflux.  Symptoms improved with use of unidentified PPI/H2 blocker.  Cancer and HPV should be considered on differential but are less likely at this point. - Advised patient to stop smoking and avoid alcohol use - Patient to complete therapy given by ENT and follow-up as appropriate  No orders of the defined types were placed in this encounter.  No orders of the defined types were placed in this encounter.   Durward Parcelavid McMullen, DO [facility], PGY-2 05/13/2017, 6:59 PM

## 2017-05-12 ENCOUNTER — Encounter: Payer: Self-pay | Admitting: Family Medicine

## 2017-05-12 ENCOUNTER — Other Ambulatory Visit: Payer: Self-pay

## 2017-05-12 ENCOUNTER — Ambulatory Visit: Payer: Medicaid Other | Admitting: Family Medicine

## 2017-05-12 VITALS — BP 134/62 | HR 80 | Temp 98.2°F | Ht 74.0 in | Wt 186.4 lb

## 2017-05-12 DIAGNOSIS — K122 Cellulitis and abscess of mouth: Secondary | ICD-10-CM | POA: Diagnosis not present

## 2017-05-12 DIAGNOSIS — F17200 Nicotine dependence, unspecified, uncomplicated: Secondary | ICD-10-CM | POA: Diagnosis not present

## 2017-05-12 DIAGNOSIS — F1020 Alcohol dependence, uncomplicated: Secondary | ICD-10-CM

## 2017-05-12 NOTE — Patient Instructions (Signed)
Thank you for coming in to see us today. Please see below to review our plan for today's visit.  Continue the course of antacids the ENT provided you.  The best prevention of your swelling be smoking and alcohol cessation.  Please call the clinic at [phone] if your symptoms worsen or you have any concerns. It was our pleasure to serve you.  Durward Parcelavid McMullen, DO [facility], PGY-2

## 2017-05-12 NOTE — Assessment & Plan Note (Addendum)
Chronic.  Current everyday smoker.  Continues to smoke 3/4 packs/day.  Not interested in cessation.  Likely contributing to reflux. - Discussed smoking cessation

## 2017-05-12 NOTE — Assessment & Plan Note (Addendum)
Chronic.  Patient continues to be in relapse.  Trigger seems to be when patient is around father.  [facility] for [facility] and therapy.  Has scheduled follow-up tomorrow. - Advised patient to follow[facility] - Discussed importance of abstinence from alcohol due to history and long-term sequelae along with primary issue for today's visit

## 2017-05-12 NOTE — Assessment & Plan Note (Addendum)
Subacute.  Was evaluated by ENT.  Consistent with irritated uvula likely from uncontrolled gastric reflux.  Symptoms improved with use of unidentified PPI/H2 blocker.  Cancer and HPV should be considered on differential but are less likely at this point. - Advised patient to stop smoking and avoid alcohol use - Patient to complete therapy given by ENT and follow-up as appropriate

## 2017-06-30 ENCOUNTER — Encounter: Payer: Self-pay | Admitting: Family Medicine

## 2017-06-30 ENCOUNTER — Other Ambulatory Visit: Payer: Self-pay

## 2017-06-30 ENCOUNTER — Ambulatory Visit: Payer: Medicaid Other | Admitting: Family Medicine

## 2017-06-30 VITALS — BP 120/68 | HR 92 | Temp 98.0°F | Ht 74.0 in | Wt 177.0 lb

## 2017-06-30 DIAGNOSIS — J358 Other chronic diseases of tonsils and adenoids: Secondary | ICD-10-CM | POA: Diagnosis not present

## 2017-06-30 DIAGNOSIS — K219 Gastro-esophageal reflux disease without esophagitis: Secondary | ICD-10-CM

## 2017-06-30 MED ORDER — PANTOPRAZOLE SODIUM 20 MG PO TBEC: 20.0000 mg | DELAYED_RELEASE_TABLET | Freq: Every day | ORAL | 0 refills | Status: AC

## 2017-06-30 NOTE — Patient Instructions (Signed)
Thank you for coming in to see [place] today. Please see below to review our plan for today's visit.  I sent in a prescription of your Protonix which you will take daily to help control reflux symptoms which I think are contributing to your throat issues.  Do not drink alcohol as this will make your throat worse.  It is best if you stop smoking to prevent further irritation of your throat and reduce risk of throat cancer in the future.  Below some information on gastric reflux.  If you continue having throat issues beyond 1 month, I would advise you to call your ear nose and throat doctor.  Do not manually disimpact your tonsil stones or stick instruments in your throat.  This can introduce bacterial infections.  Please call the clinic at [phone] if your symptoms worsen or you have any concerns. It was our pleasure to serve you.  [name], DO [facility], PGY-2     Heartburn Heartburn is a type of pain or discomfort that can happen in the throat or chest. It is often described as a burning pain. It may also cause a bad taste in the mouth. Heartburn may feel worse when you lie down or bend over. It may be caused by stomach contents that move back up (reflux) into the tube that connects the mouth with the stomach (esophagus). Follow these instructions at home: Take these actions to lessen your discomfort and to help avoid problems. Diet  Follow a diet as told by your doctor. You may need to avoid foods and drinks such as: ? Coffee and tea (with or without caffeine). ? Drinks that contain alcohol. ? Energy drinks and sports drinks. ? Carbonated drinks or sodas. ? Chocolate and cocoa. ? Peppermint and mint flavorings. ? Garlic and onions. ? Horseradish. ? Spicy and acidic foods, such as peppers, chili powder, curry powder, vinegar, hot sauces, and BBQ sauce. ? Citrus fruit juices and citrus fruits, such as oranges, lemons, and limes. ? Tomato-based foods, such as red  sauce, chili, salsa, and pizza with red sauce. ? Fried and fatty foods, such as donuts, french fries, potato chips, and high-fat dressings. ? High-fat meats, such as hot dogs, rib eye steak, sausage, ham, and bacon. ? High-fat dairy items, such as whole milk, butter, and cream cheese.  Eat small meals often. Avoid eating large meals.  Avoid drinking large amounts of liquid with your meals.  Avoid eating meals during the 2-3 hours before bedtime.  Avoid lying down right after you eat.  Do not exercise right after you eat. General instructions  Pay attention to any changes in your symptoms.  Take over-the-counter and prescription medicines only as told by your doctor. Do not take aspirin, ibuprofen, or other NSAIDs unless your doctor says it is okay.  Do not use any tobacco products, including cigarettes, chewing tobacco, and e-cigarettes. If you need help quitting, ask your doctor.  Wear loose clothes. Do not wear anything tight around your waist.  Raise (elevate) the head of your bed about 6 inches (15 cm).  Try to lower your stress. If you need help doing this, ask your doctor.  If you are overweight, lose an amount of weight that is healthy for you. Ask your doctor about a safe weight loss goal.  Keep all follow-up visits as told by your doctor. This is important. Contact a doctor if:  You have new symptoms.  You lose weight and you do not know why  it is happening.  You have trouble swallowing, or it hurts to swallow.  You have wheezing or a cough that keeps happening.  Your symptoms do not get better with treatment.  You have heartburn often for more than two weeks. Get help right away if:  You have pain in your arms, neck, jaw, teeth, or back.  You feel sweaty, dizzy, or light-headed.  You have chest pain or shortness of breath.  You throw up (vomit) and your throw up looks like blood or coffee grounds.  Your poop (stool) is bloody or black. This information  is not intended to replace advice given to you by your health care provider. Make sure you discuss any questions you have with your health care provider. Document Released: 12/30/2010 Document Revised: 09/25/2015 Document Reviewed: 08/14/2014 Elsevier Interactive Patient Education  [employer]2018 [employer]

## 2017-06-30 NOTE — Assessment & Plan Note (Addendum)
Acute on chronic.  Likely due to chronic alcohol abuse.  Patient states he has been sober for 3 weeks.  Patient also endorsing annual disimpaction and use of nonsterile instruments in back of throat to remove tonsil stones which may have aggravated his pharynx.  He does have concern for HPV but there is no obvious mass in the throat.  Patient without red flags or systemic symptoms of infection. - Protonix 20 mg daily for 1 month trial - Advised to stop smoking and avoid alcohol use and other triggers of reflux - Reviewed return precautions

## 2017-06-30 NOTE — Assessment & Plan Note (Addendum)
Chronic.  Persistent issue of likely exacerbated by chronic reflux and alcohol use disorder.  Patient does not brushes teeth twice daily.  Dentition appeared to be normal though multiple tonsil stones are evident. - See plan for GERD - Advised to brush teeth for 2 minutes twice daily - Follow-up with ENT if continues to be a chronic issue duration of PPI treatment

## 2017-06-30 NOTE — Progress Notes (Signed)
   Subjective   Patient ID: Paul Morris    DOB: 08-Mar-1986, 32 y.o. male   MRN: [id number]  CC: "Wart on throat"  HPI: Paul Morris is a 32 y.o. male who presents to clinic today for the following:  Throat bump: Onset 2 weeks ago.  Patient has been manually distracting chronic tonsil stones.  He subsequently noticed a bump on the region of his throat.  He then proceeded to use tweezers to pick at the lesion.  He states that the lesion has not changed in size.  He has been sober for 3 weeks but does have a chronic alcohol use disorder.  He continues to smoke every day.  He previously was evaluated by Dr. Ezzard StandingNewman with ENT for a different lesion on the uvula which appears to be likely related to chronic reflux secondary to his chronic alcohol use.  Patient denies fevers or chills, nausea or vomiting, dysphasia, shortness of breath, chest pain, abdominal pain.  ROS: see HPI for pertinent.  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 Smoking status reviewed. Medications reviewed.  Objective   BP 120/68   Pulse 92   Temp 98 F (36.7 C) (Oral)   Ht 6\' 2"  (1.88 m)   Wt 177 lb (80.3 kg)   SpO2 96%   BMI 22.73 kg/m  Vitals and nursing note reviewed.  General: well nourished, well developed, NAD with non-toxic appearance HEENT: normocephalic, atraumatic, moist mucous membranes, erythematous pharynx without obvious new lesion, chronic unchanged irregularity of the uvula, multiple tonsil stones present Neck: supple, non-tender without lymphadenopathy Cardiovascular: regular rate and rhythm without murmurs, rubs, or gallops Lungs: clear to auscultation bilaterally with normal work of breathing Abdomen: soft, non-tender, non-distended, normoactive bowel sounds Skin: warm, dry, no rashes or lesions, cap refill < 2 seconds Extremities: warm  and well perfused, normal tone, no edema  Assessment & Plan   Tonsillolith Chronic.  Persistent issue of likely exacerbated by chronic reflux and alcohol use disorder.  Patient does not brushes teeth twice daily.  Dentition appeared to be normal though multiple tonsil stones are evident. - See plan for GERD - Advised to brush teeth for 2 minutes twice daily - Follow-up with ENT if continues to be a chronic issue duration of PPI treatment  GERD (gastroesophageal reflux disease) Acute on chronic.  Likely due to chronic alcohol abuse.  Patient states he has been sober for 3 weeks.  Patient also endorsing annual disimpaction and use of nonsterile instruments in back of throat to remove tonsil stones which may have aggravated his pharynx.  He does have concern for HPV but there is no obvious mass in the throat.  Patient without red flags or systemic symptoms of infection. - Protonix 20 mg daily for 1 month trial - Advised to stop smoking and avoid alcohol use and other triggers of reflux - Reviewed return precautions  No orders of the defined types were placed in this encounter.  Meds ordered this encounter  Medications  . pantoprazole (PROTONIX) 20 MG tablet    Sig: Take 1 tablet (20 mg total) by mouth daily.    Dispense:  30 tablet    Refill:  0    Durward Parcelavid Labella Zahradnik, DO [facility], PGY-2 06/30/2017, 9:40 AM

## 2017-08-29 DIAGNOSIS — L720 Epidermal cyst: Secondary | ICD-10-CM | POA: Insufficient documentation

## 2017-08-29 DIAGNOSIS — J358 Other chronic diseases of tonsils and adenoids: Secondary | ICD-10-CM | POA: Insufficient documentation

## 2017-09-25 ENCOUNTER — Encounter (HOSPITAL_COMMUNITY): Payer: Self-pay | Admitting: Emergency Medicine

## 2017-09-25 ENCOUNTER — Ambulatory Visit (HOSPITAL_COMMUNITY)
Admission: EM | Admit: 2017-09-25 | Discharge: 2017-09-25 | Disposition: A | Discharge status: Home / Self Care | Payer: Medicaid Other | Attending: Family Medicine | Admitting: Family Medicine

## 2017-09-25 DIAGNOSIS — J039 Acute tonsillitis, unspecified: Secondary | ICD-10-CM

## 2017-09-25 DIAGNOSIS — H6523 Chronic serous otitis media, bilateral: Secondary | ICD-10-CM

## 2017-09-25 MED ORDER — CLINDAMYCIN HCL 150 MG PO CAPS: 150.0000 mg | ORAL_CAPSULE | Freq: Three times a day (TID) | ORAL | 0 refills | Status: DC

## 2017-09-25 NOTE — ED Triage Notes (Signed)
Pt c/o bump on his tonsils and L ear pain, and swollen lymph nodes.

## 2017-09-25 NOTE — ED Provider Notes (Signed)
[facility] CARE CENTER   [id number] 09/25/17 Arrival Time: 1453   SUBJECTIVE:  Paul Morris is a 32 y.o. male who presents to the urgent care with complaint of bump on his tonsils and L ear pain, and swollen lymph nodes.   Patient has seen his doctor twice, and symptoms have gotten worse.  Originally, he was picking at a tonsillar concretion himself.  Patient has had myringotomy tubes in the past.  No fever  History reviewed. No pertinent past medical history. Family History  Problem Relation Age of Onset  . Alcohol abuse Mother   . Drug abuse Mother   . Alcohol abuse Father   . Drug abuse Father    Social History   Socioeconomic History  . Marital status: Single    Spouse name: Not on file  . Number of children: Not on file  . Years of education: Not on file  . Highest education level: Not on file  Occupational History  . Not on file  Social Needs  . Financial resource strain: Not on file  . Food insecurity:    Worry: Not on file    Inability: Not on file  . Transportation needs:    Medical: Not on file    Non-medical: Not on file  Tobacco Use  . Smoking status: Current Every Day Smoker    Packs/day: 1.00    Types: Cigarettes  . Smokeless tobacco: Never Used  Substance and Sexual Activity  . Alcohol use: Yes  . Drug use: No  . Sexual activity: Not on file  Lifestyle  . Physical activity:    Days per week: Not on file    Minutes per session: Not on file  . Stress: Not on file  Relationships  . Social connections:    Talks on phone: Not on file    Gets together: Not on file    Attends religious service: Not on file    Active member of club or organization: Not on file    Attends meetings of clubs or organizations: Not on file    Relationship status: Not on file  . Intimate partner violence:    Fear of current or ex partner: Not on file    Emotionally abused: Not on file    Physically abused: Not on file    Forced sexual activity: Not on file    Other Topics Concern  . Not on file  Social History Narrative  . Not on file   No outpatient medications have been marked as taking for the 09/25/17 encounter [facility] Encounter).      ROS: As per HPI, remainder of ROS negative.   OBJECTIVE:   Vitals:   09/25/17 1545  BP: 103/70  Pulse: 88  Resp: 16  Temp: 97.7 F (36.5 C)  SpO2: 97%     General appearance: alert; no distress Eyes: PERRL; EOMI; conjunctiva normal HENT: normocephalic; atraumatic; TMs bilateral myringotomy scars and some retraction of the left TM, canal normal, external ears normal without trauma; nasal mucosa normal; oral mucosa swollen left tonsil with erythema Neck: supple; mild left submandibular adenopathy Back: no CVA tenderness Extremities: no cyanosis or edema; symmetrical with no gross deformities Skin: warm and dry Neurologic: normal gait; grossly normal Psychological: alert and cooperative; normal mood and affect      Labs:  Results for orders placed or performed in visit on 04/05/17  HIV antibody (with reflex)  Result Value Ref Range   HIV Screen 4th Generation wRfx Non Reactive Non Reactive  Labs Reviewed - No data to display  No results found.     ASSESSMENT & PLAN:  1. Tonsillitis   2. Bilateral chronic serous otitis media     Meds ordered this encounter  Medications  . clindamycin (CLEOCIN) 150 MG capsule    Sig: Take 1 capsule (150 mg total) by mouth 3 (three) times daily.    Dispense:  21 capsule    Refill:  0    Reviewed expectations re: course of current medical issues. Questions answered. Outlined signs and symptoms indicating need for more acute intervention. Patient verbalized understanding. After Visit Summary given.       Elvina Sidle, MD 09/25/17 [phone]

## 2017-09-30 ENCOUNTER — Ambulatory Visit: Payer: Medicaid Other | Admitting: Family Medicine

## 2017-09-30 IMAGING — CT CT HEAD W/O CM
2 series · 17 of 30 positions shown, 20 images · non-contrast
Comparison: None.

CLINICAL DATA: Hallucinations.

EXAM:
CT HEAD WITHOUT CONTRAST
TECHNIQUE: Contiguous axial images were obtained from the base of the skull
through the vertex without intravenous contrast.

[Series 2: head w/o · axial · non-contrast · 0.45mm/px · z∈[-145,-20]mm · 9 of 33 slices shown, 12 images]
[im 4/33  brain]
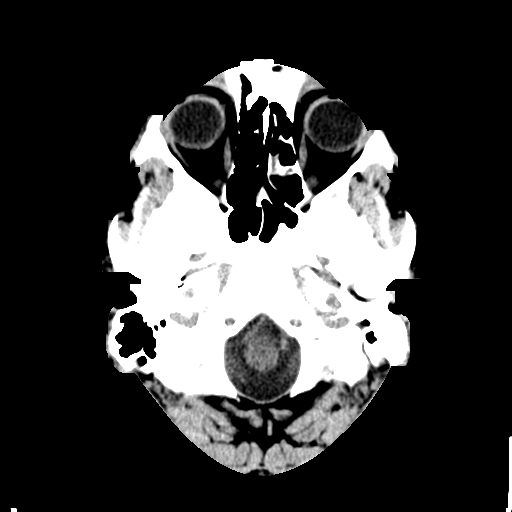
[im 4/33  bone]
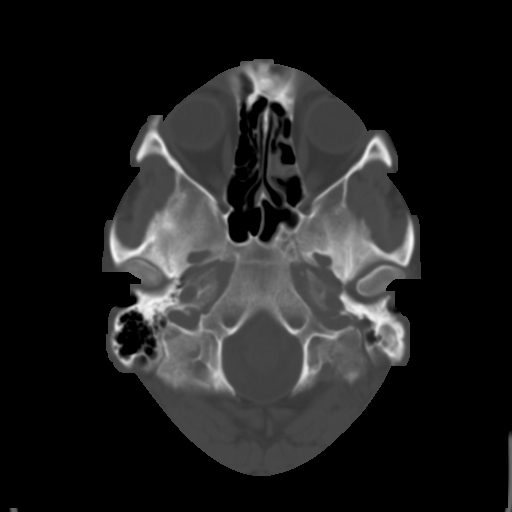
[im 7/33  brain]
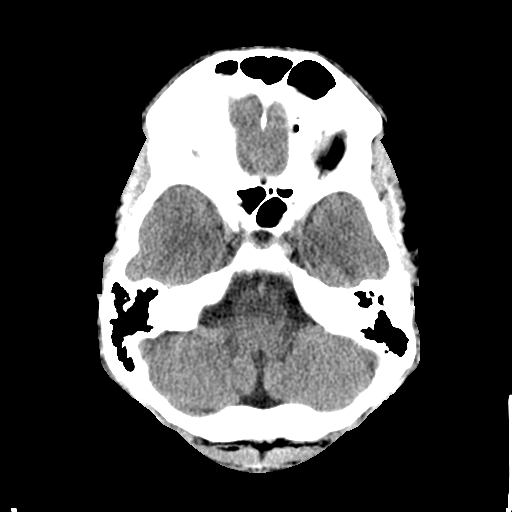
[im 10/33  brain]
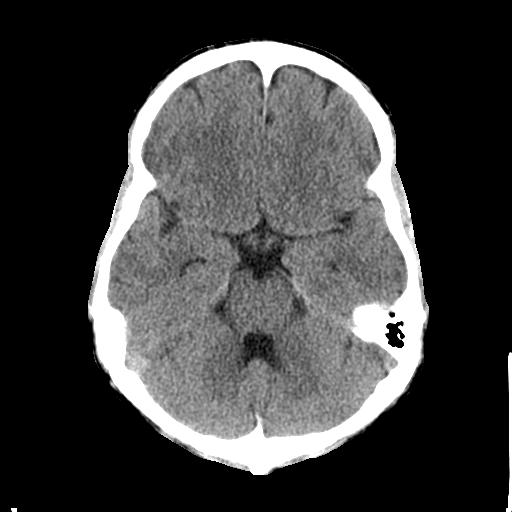
[im 13/33  brain]
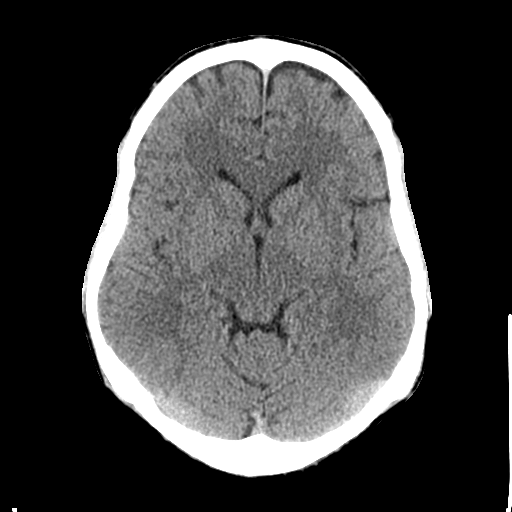
[im 17/33  brain]
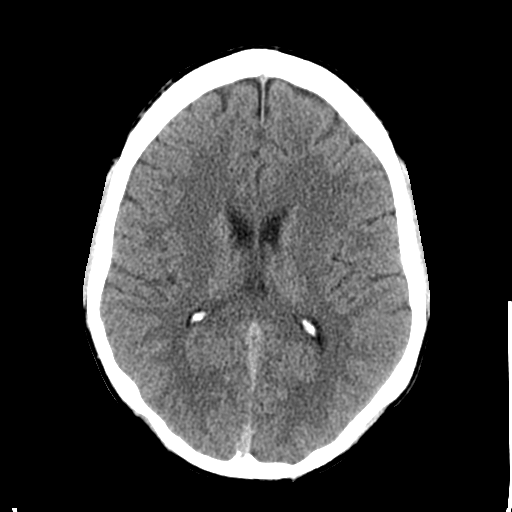
[im 17/33  bone]
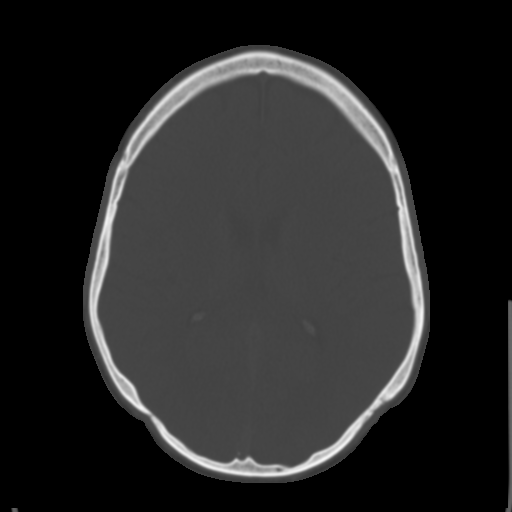
[im 20/33  brain]
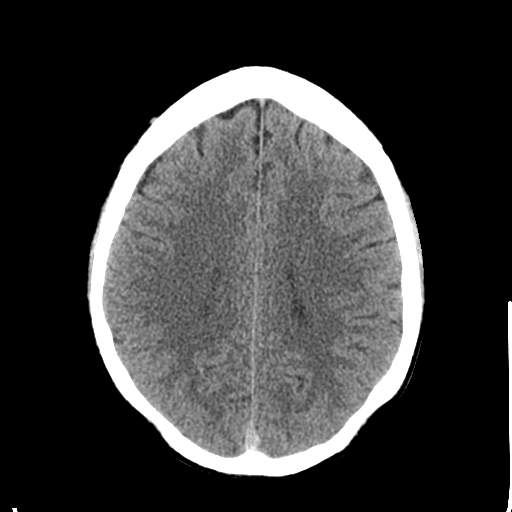
[im 23/33  brain]
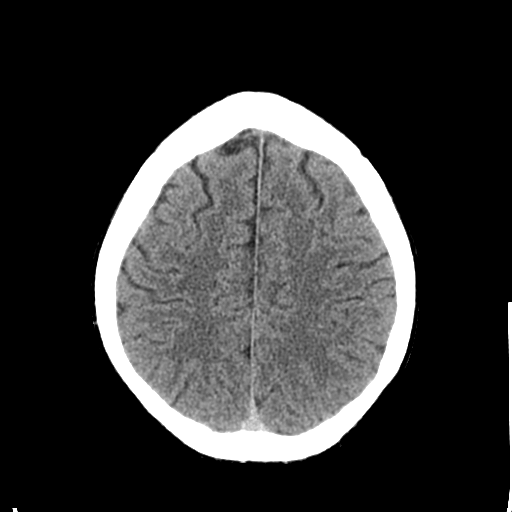
[im 26/33  brain]
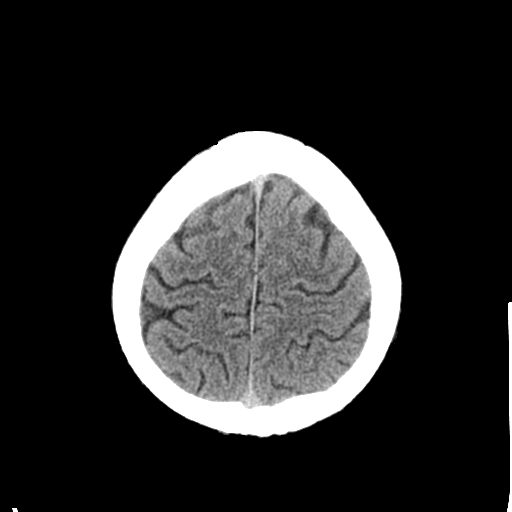
[im 29/33  brain]
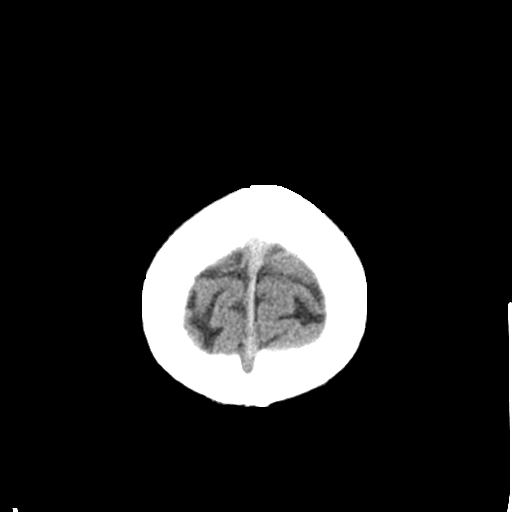
[im 29/33  bone]
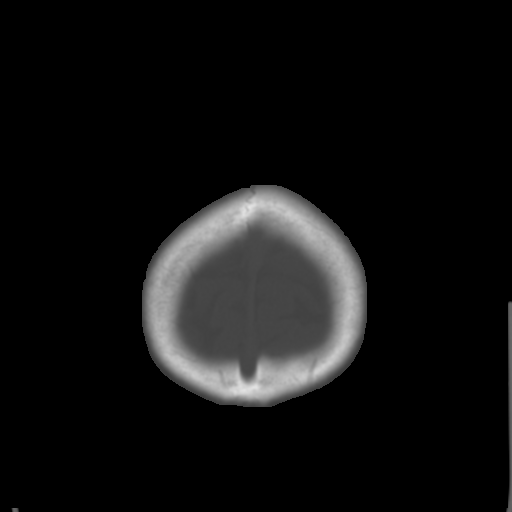

[Series 3: bone windows · axial · 0.45mm/px · z∈[-145,-19]mm · 8 of 54 slices shown]
[im 6/54  bone]
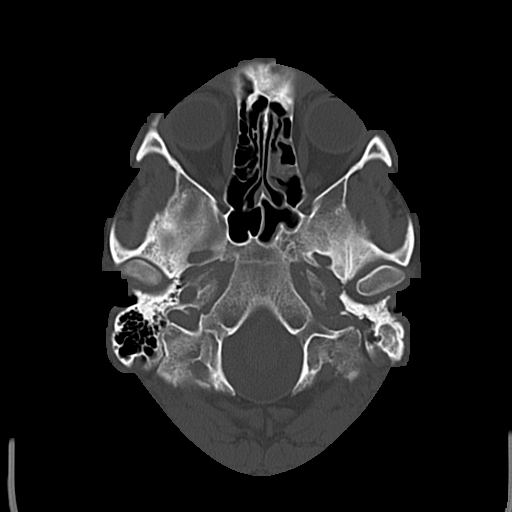
[im 12/54  bone]
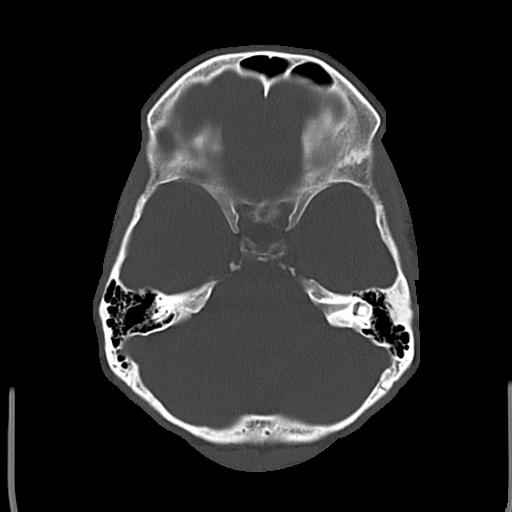
[im 18/54  bone]
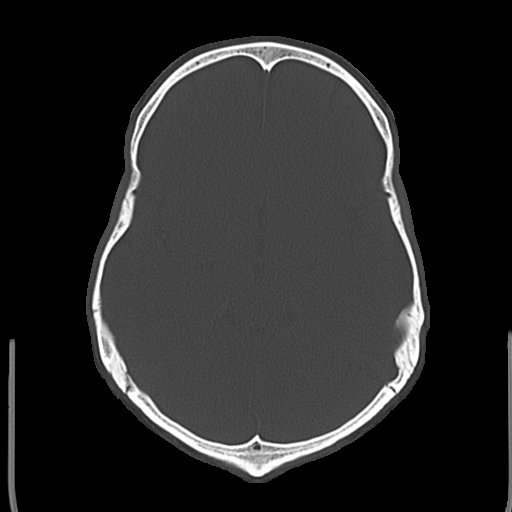
[im 24/54  bone]
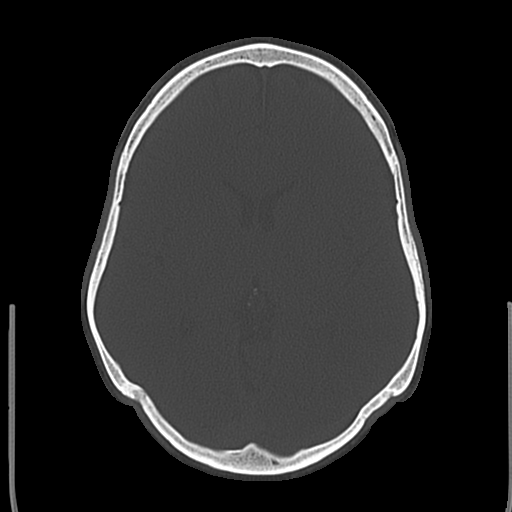
[im 30/54  bone]
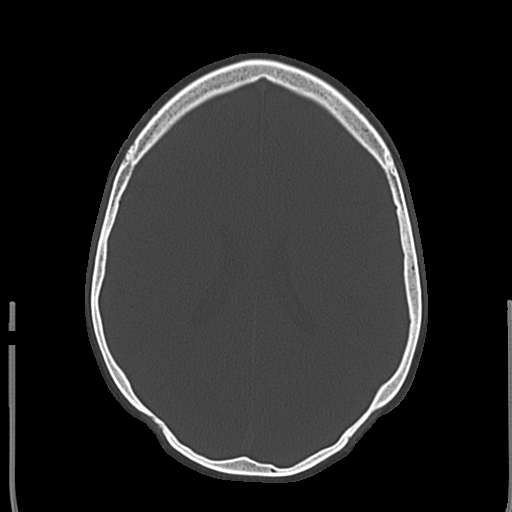
[im 36/54  bone]
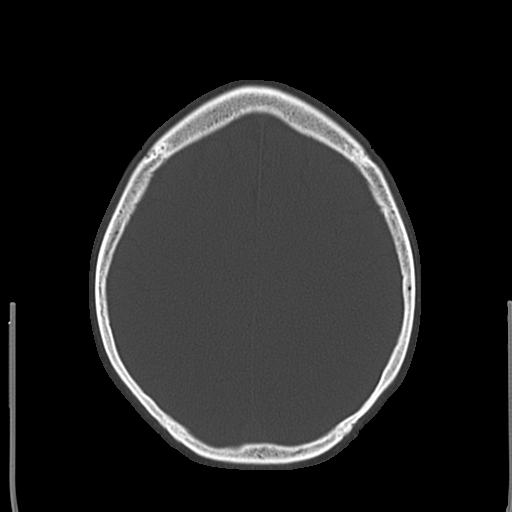
[im 42/54  bone]
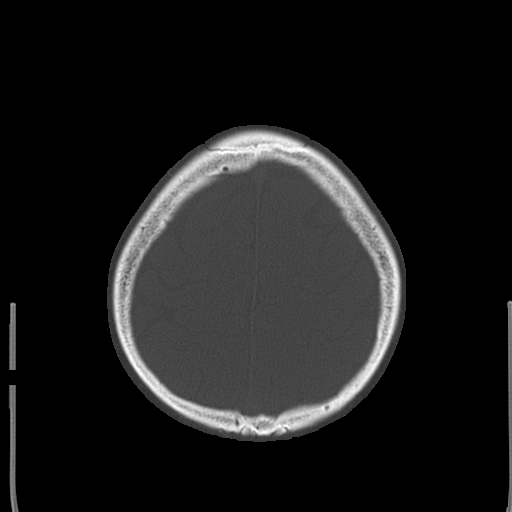
[im 48/54  bone]
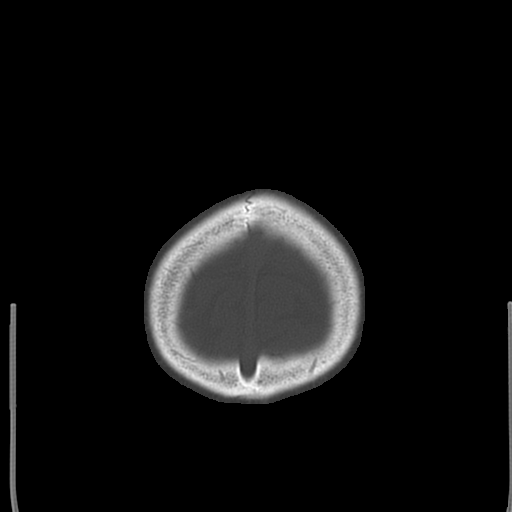

[17 of 30 positions shown; findings below may reference images not displayed]

FINDINGS: No evidence of parenchymal hemorrhage or extra-axial fluid
collection. No mass lesion, mass effect, or midline shift.

No CT evidence of acute infarction.

Cerebral volume is age appropriate. No ventriculomegaly.

Partial opacification of the left greater than right ethmoidal air
cells. Mild mucosal thickening in the frontal and visualized
maxillary sinuses. No air-fluid levels in the visualized paranasal
sinuses. The mastoid air cells are unopacified. No evidence of
calvarial fracture.
IMPRESSION: No evidence of acute intracranial abnormality. Mild paranasal
sinusitis, probably chronic.

## 2017-10-14 ENCOUNTER — Ambulatory Visit: Payer: Medicaid Other | Admitting: Family Medicine

## 2017-10-14 ENCOUNTER — Other Ambulatory Visit: Payer: Self-pay

## 2017-10-14 ENCOUNTER — Encounter: Payer: Self-pay | Admitting: Family Medicine

## 2017-10-14 DIAGNOSIS — F1020 Alcohol dependence, uncomplicated: Secondary | ICD-10-CM | POA: Diagnosis not present

## 2017-10-14 DIAGNOSIS — R591 Generalized enlarged lymph nodes: Secondary | ICD-10-CM | POA: Diagnosis not present

## 2017-10-14 DIAGNOSIS — L709 Acne, unspecified: Secondary | ICD-10-CM | POA: Insufficient documentation

## 2017-10-14 DIAGNOSIS — L700 Acne vulgaris: Secondary | ICD-10-CM | POA: Diagnosis not present

## 2017-10-14 NOTE — Assessment & Plan Note (Signed)
Acute on chronic.  Minimal acne.  Solitary Whitehead on nose and small and on posterior right earlobe.  No signs of abscesses. - Advised patient not to squeeze pimples

## 2017-10-14 NOTE — Assessment & Plan Note (Signed)
Subacute.  Likely secondary to tonsillitis which was diagnosed at urgent care several weeks ago.  Patient completed course of clindamycin successfully.  No signs of residual lymphadenopathy.  Tonsils are without enlargement.  Patient does have chronic reflux secondary to his alcohol use. - Discussed importance of alcohol abstinence - Instructed to take Zantac 75 mg twice daily - Reviewed return precautions

## 2017-10-14 NOTE — Patient Instructions (Signed)
Thank you for coming in to see us today. Please see below to review our plan for today's visit.  1.  Your ear fullness should improve over the course of the next month.  This is likely related to your recent episode of tonsillitis.  Return to the clinic in 1 month if you are still having issues with hearing in her left ear. 2.  There are pimples do not need a dermatologist.  Do not pop these as this can introduce infection.  These will go away with time. 3.  Your swelling seems to have improved following the clindamycin use.  The cyst on your throat has not changed and is likely related to your chronic reflux and alcohol use.  It is important that you remain sober.  Please call the clinic at [phone] if your symptoms worsen or you have any concerns. It was our pleasure to serve you.  Durward Parcelavid Jerin Franzel, DO [facility], PGY-2

## 2017-10-14 NOTE — Progress Notes (Signed)
   Subjective   Patient ID: Paul HeckDaniel C Midkiff    DOB: 1986/04/11, 32 y.o. male   MRN: [id number]  CC: "Swollen lymph node"  HPI: Paul Morris is a 32 y.o. male who presents to clinic today for the following:  Lymphadenopathy: Occurred approximately 4 weeks ago around the time patient began experiencing sore throat and was diagnosed with tonsillitis at an urgent care.  Patient was given clindamycin and completed treatment.  He feels that he still has localized adenopathy to his left anterior cervical chain.  She denies fevers or chills, rhinorrhea, nausea or vomiting, fatigue, dysphasia, chest pain, shortness of breath.  He does have some sensation of fluid in his left ear around the time he was diagnosed with tonsillitis.  He feels that his hearing has improved some.  Spot on nose: Patient noticed a few days ago.  Has occasionally squeezed the spot on his posterior right earlobe.  He does have one on his left nasal bridge.  Alcohol use disorder: Patient with long-standing alcohol use disorder.  Last drink yesterday.  Patient states he was with his mother and brother who were drinking last week, patient avoided alcohol.  It was not until patient was alone at home that he began to drink.  [facility].  ROS: see HPI for pertinent.  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 Smoking status reviewed. Medications reviewed.  Objective   BP 118/70   Pulse 81   Temp 98 F (36.7 C) (Oral)   Ht 6\' 2"  (1.88 m)   Wt 177 lb (80.3 kg)   SpO2 96%   BMI 22.73 kg/m  Vitals and nursing note reviewed.  General: well nourished, well developed, NAD with non-toxic appearance HEENT: normocephalic, atraumatic, moist mucous membranes, tonsils without enlargement, chronic cyst on uvula, tonsil stones  present Neck: supple, non-tender without lymphadenopathy Cardiovascular: regular rate and rhythm without murmurs, rubs, or gallops Lungs: clear to auscultation bilaterally with normal work of breathing Abdomen: soft, non-tender, non-distended, normoactive bowel sounds Skin: warm, dry, cap refill < 2 seconds, solitary Whitehead on left nasal bridge and posterior right earlobe Extremities: warm and well perfused, normal tone, no edema  Assessment & Plan   Alcohol use disorder, severe, dependence (HCC) Chronic.  In relapse. - Follow-up with [facility] - Discussed at length importance of alcohol abstinence  Acne Acute on chronic.  Minimal acne.  Solitary Whitehead on nose and small and on posterior right earlobe.  No signs of abscesses. - Advised patient not to squeeze pimples  Lymphadenopathy Subacute.  Likely secondary to tonsillitis which was diagnosed at urgent care several weeks ago.  Patient completed course of clindamycin successfully.  No signs of residual lymphadenopathy.  Tonsils are without enlargement.  Patient does have chronic reflux secondary to his alcohol use. - Discussed importance of alcohol abstinence - Instructed to take Zantac 75 mg twice daily - Reviewed return precautions  No orders of the defined types were placed in this encounter.  No orders of the defined types were placed in this encounter.   Durward Parcelavid Deward Sebek, DO [facility], PGY-2 10/14/2017, 5:13 PM

## 2017-10-14 NOTE — Assessment & Plan Note (Signed)
Chronic.  In relapse. - Follow-up with [facility] - Discussed at length importance of alcohol abstinence

## 2018-05-11 ENCOUNTER — Ambulatory Visit (HOSPITAL_COMMUNITY)
Admission: RE | Admit: 2018-05-11 | Discharge: 2018-05-11 | Disposition: A | Discharge status: Home / Self Care | Payer: Medicaid Other | Source: Ambulatory Visit | Attending: Family Medicine | Admitting: Family Medicine

## 2018-05-11 ENCOUNTER — Ambulatory Visit (INDEPENDENT_AMBULATORY_CARE_PROVIDER_SITE_OTHER): Payer: Medicaid Other | Admitting: Family Medicine

## 2018-05-11 VITALS — BP 120/80 | HR 85 | Temp 97.5°F | Wt 182.4 lb

## 2018-05-11 DIAGNOSIS — R079 Chest pain, unspecified: Secondary | ICD-10-CM

## 2018-05-11 DIAGNOSIS — Z0000 Encounter for general adult medical examination without abnormal findings: Secondary | ICD-10-CM | POA: Diagnosis present

## 2018-05-11 DIAGNOSIS — F17200 Nicotine dependence, unspecified, uncomplicated: Secondary | ICD-10-CM | POA: Diagnosis not present

## 2018-05-11 DIAGNOSIS — K219 Gastro-esophageal reflux disease without esophagitis: Secondary | ICD-10-CM | POA: Diagnosis not present

## 2018-05-11 DIAGNOSIS — R002 Palpitations: Secondary | ICD-10-CM | POA: Insufficient documentation

## 2018-05-11 DIAGNOSIS — F1020 Alcohol dependence, uncomplicated: Secondary | ICD-10-CM | POA: Diagnosis not present

## 2018-05-11 NOTE — Progress Notes (Signed)
Subjective   Patient ID: Paul Morris    DOB: 1986-04-07, 33 y.o. male   MRN: [id number]  CC: "Routine physical"  HPI: Paul Morris is a 33 y.o. male who presents to clinic today for the following:  Annual physical: Patient presented today for routine annual physical.  Does have complaints of recent new onset left-sided chest discomfort.  Continues to smoke.  Recently went into relapse regarding alcohol use over [place] with last drink on New Year's Day.  Left-sided chest pain: Patient reports 4 weeks of intermittent chest pain without radiation.  Pain has a "beating sensation."  This does not appear to be exacerbated with exertion or improved with rest.  Patient has no prior history of similar discomfort.  Does have history of reflux and anxiety, however this feels different.  Patient does have some sensation of shortness of breath during episodes but denies diaphoresis.  Patient states his fit bit was reporting HR in the 70s.  He does report a family history of heart disease in both of his uncles and grandfather but none of them have had an MI prior to age 42.  Smoking: Patient currently smoking while using nicotine gum.  He has been able to cut back but has not been then motivated to quit altogether as of yet.  Patient denies cough, unexplained weight loss, but does have chest pain or shortness of breath as mentioned above.  Alcohol use disorder: Patient only recently went into relapse over the New Year's holiday with his last drink on New Year's Day.  He does report some sensation of reflux but is unsure if his sided chest discomfort and palpitations is related to his drinking.  ROS: see HPI for pertinent.  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 Smoking status reviewed. Medications reviewed.  Objective   BP 120/80    Pulse 85   Temp (!) 97.5 F (36.4 C) (Oral)   Wt 182 lb 6.4 oz (82.7 kg)   SpO2 97%   BMI 23.42 kg/m  Vitals and nursing note reviewed.  General: well nourished, well developed, NAD with non-toxic appearance HEENT: normocephalic, atraumatic, moist mucous membranes Neck: supple, non-tender without lymphadenopathy Cardiovascular: regular rate and rhythm without murmurs, rubs, or gallops Lungs: clear to auscultation bilaterally with normal work of breathing Abdomen: soft, non-tender, non-distended, normoactive bowel sounds Skin: warm, dry, no rashes or lesions, cap refill < 2 seconds Extremities: warm and well perfused, normal tone, no edema  Assessment & Plan   Alcohol use disorder, severe, dependence (HCC) Chronic.  Recurrent relapse.  Does not appear to be in withdrawal.  Suspect this may be contributing to complaints of chest pain and palpitations today. - Discussed importance of abstinence and recommended follow-up with [facility]  Tobacco use disorder Chronic.  Continues to use cigarettes while on nicotine gum.  - Discussed importance of avoiding all nicotine products while on replacement therapy  GERD (gastroesophageal reflux disease) Chronic.  Intermittent episodes due to alcohol use disorder.  May be source of atypical chest pain. - Instructed patient to use prescription for Protonix as needed - See plan for alcohol use disorder and chest pain  Chest pain Patient presenting for annual wellness visit with intermittent atypical chest pain episodes over the last few weeks.  No prior history of similar chest pain.  No family history of early onset cardiac disease.  Does have some associated palpitations and has history  of anxiety and reflux.  EKG NSR without ST changes or T wave abnormalities, no HR block.  Patient is a smoker.  Well-appearing on exam.  Chest pain nonreproducible.  Well score 0 making PE unlikely.  Vitals stable.  Likely noncardiac chest pain.  Symptoms probably  related to recent alcohol relapse.   - Reviewed return precautions - Discussed importance of smoking cessation and alcohol abstinence - Checking TSH, CBC, CMET  Orders Placed This Encounter  Procedures  . TSH  . CBC  . CMP14+EGFR  . EKG 12-Lead   No orders of the defined types were placed in this encounter.   Harriet Butte, [place], PGY-3 05/12/2018, 9:36 AM

## 2018-05-11 NOTE — Patient Instructions (Addendum)
Thank you for coming in to see Paul Morris today. Please see below to review our plan for today's visit.  You EKG looks normal.  We will check your blood levels today and I will call you if there are any abnormalities within 48 hours, otherwise you will receive results in the mail.  I do believe your alcohol intake is related to your chest discomfort and palpitations.  It is important that you avoid all levels of alcohol.    Please call the clinic at [phone] if your symptoms worsen or you have any concerns. It was our pleasure to serve you.  [name], DO [facility], PGY-3

## 2018-05-12 ENCOUNTER — Encounter: Payer: Self-pay | Admitting: Family Medicine

## 2018-05-12 DIAGNOSIS — R079 Chest pain, unspecified: Secondary | ICD-10-CM | POA: Insufficient documentation

## 2018-05-12 LAB — CMP14+EGFR
ALBUMIN: 4.6 g/dL (ref 3.5–5.5)
ALT: 18 IU/L (ref 0–44)
AST: 17 IU/L (ref 0–40)
Albumin/Globulin Ratio: 2.1 (ref 1.2–2.2)
Alkaline Phosphatase: 67 IU/L (ref 39–117)
BUN/Creatinine Ratio: 6 — ABNORMAL LOW (ref 9–20)
BUN: 6 mg/dL (ref 6–20)
Bilirubin Total: 0.2 mg/dL (ref 0.0–1.2)
CALCIUM: 9.3 mg/dL (ref 8.7–10.2)
CO2: 26 mmol/L (ref 20–29)
CREATININE: 0.97 mg/dL (ref 0.76–1.27)
Chloride: 102 mmol/L (ref 96–106)
GFR, EST AFRICAN AMERICAN: 119 mL/min/{1.73_m2} (ref >59)
GFR, EST NON AFRICAN AMERICAN: 103 mL/min/{1.73_m2} (ref >59)
GLOBULIN, TOTAL: 2.2 g/dL (ref 1.5–4.5)
Glucose: 88 mg/dL (ref 65–99)
Potassium: 4 mmol/L (ref 3.5–5.2)
Sodium: 142 mmol/L (ref 134–144)
TOTAL PROTEIN: 6.8 g/dL (ref 6.0–8.5)

## 2018-05-12 LAB — CBC
Hematocrit: 39.6 % (ref 37.5–51.0)
Hemoglobin: 14.3 g/dL (ref 13.0–17.7)
MCH: 33.6 pg — ABNORMAL HIGH (ref 26.6–33.0)
MCHC: 36.1 g/dL — ABNORMAL HIGH (ref 31.5–35.7)
MCV: 93 fL (ref 79–97)
Platelets: 151 10*3/uL (ref 150–450)
RBC: 4.26 x10E6/uL (ref 4.14–5.80)
RDW: 12 % (ref 11.6–15.4)
WBC: 10.9 10*3/uL — ABNORMAL HIGH (ref 3.4–10.8)

## 2018-05-12 LAB — TSH: TSH: 1.94 u[IU]/mL (ref 0.450–4.500)

## 2018-05-12 NOTE — Assessment & Plan Note (Signed)
Chronic.  Continues to use cigarettes while on nicotine gum.  - Discussed importance of avoiding all nicotine products while on replacement therapy

## 2018-05-12 NOTE — Assessment & Plan Note (Addendum)
Patient presenting for annual wellness visit with intermittent atypical chest pain episodes over the last few weeks.  No prior history of similar chest pain.  No family history of early onset cardiac disease.  Does have some associated palpitations and has history of anxiety and reflux.  EKG NSR without ST changes or T wave abnormalities, no HR block.  Patient is a smoker.  Well-appearing on exam.  Chest pain nonreproducible.  Well score 0 making PE unlikely.  Vitals stable.  Likely noncardiac chest pain.  Symptoms probably related to recent alcohol relapse.   - Reviewed return precautions - Discussed importance of smoking cessation and alcohol abstinence - Checking TSH, CBC, CMET

## 2018-05-12 NOTE — Assessment & Plan Note (Signed)
Chronic.  Intermittent episodes due to alcohol use disorder.  May be source of atypical chest pain. - Instructed patient to use prescription for Protonix as needed - See plan for alcohol use disorder and chest pain

## 2018-05-12 NOTE — Assessment & Plan Note (Signed)
Chronic.  Recurrent relapse.  Does not appear to be in withdrawal.  Suspect this may be contributing to complaints of chest pain and palpitations today. - Discussed importance of abstinence and recommended follow-up with [facility]

## 2018-11-04 IMAGING — US US SOFT TISSUE HEAD/NECK
1 series · 13 of 25 positions shown · non-contrast
Comparison: None.

CLINICAL DATA: Palpable abnormality. Palpable abnormality just
right of midline of the neck above the thyroid cartilage.
Thyroglossal duct cyst.

EXAM:
THYROID ULTRASOUND
TECHNIQUE: Ultrasound examination of the thyroid gland and adjacent soft
tissues was performed.

[Series 1: us soft tissue head/neck · 0.07mm/px · 13 of 56 slices shown]
[im 1/56]
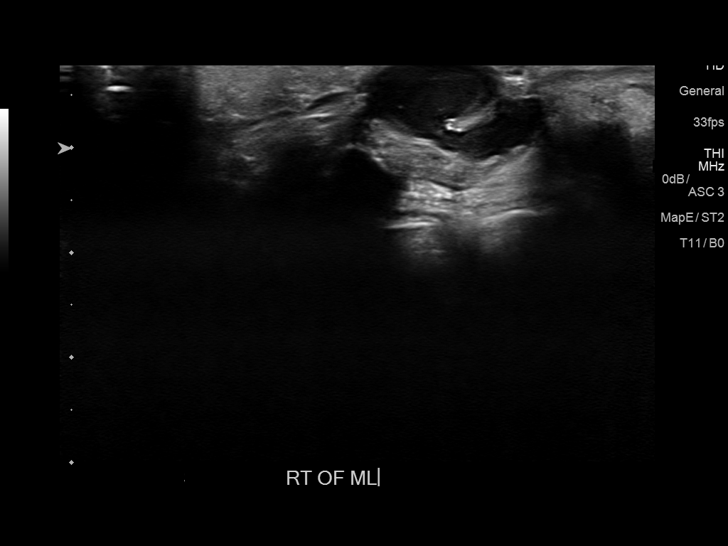
[im 5/56]
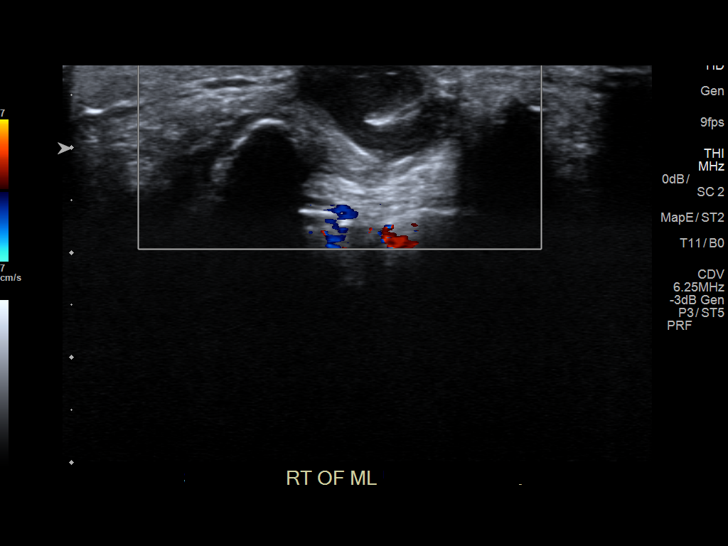
[im 10/56]
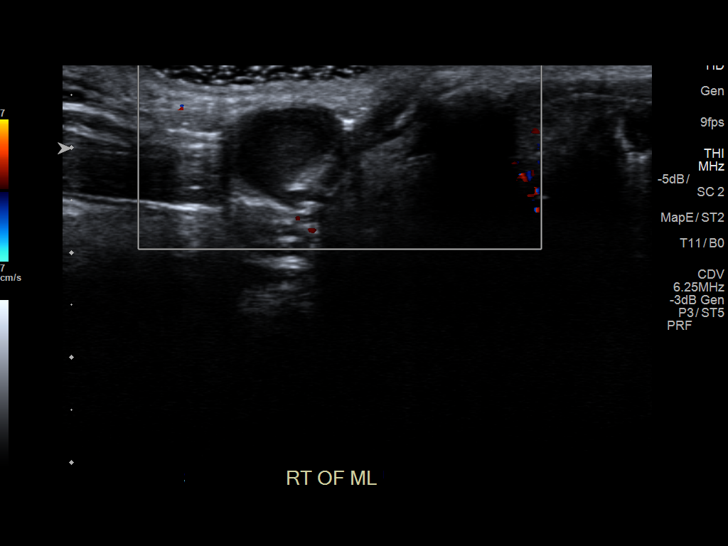
[im 14/56]
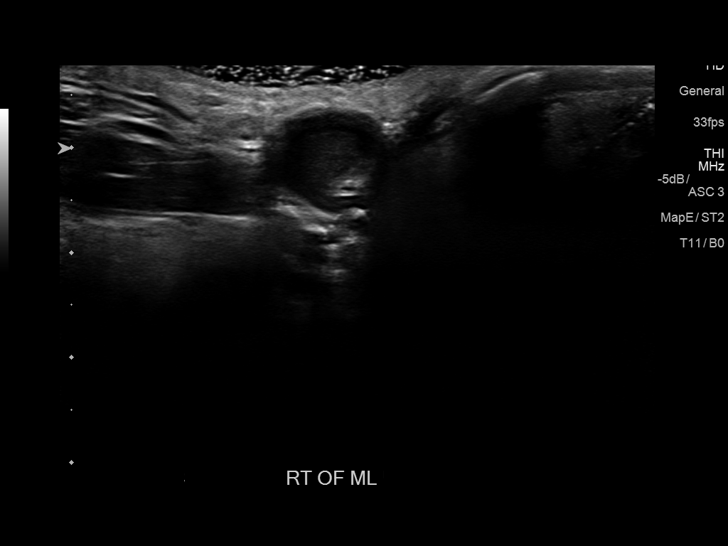
[im 19/56]
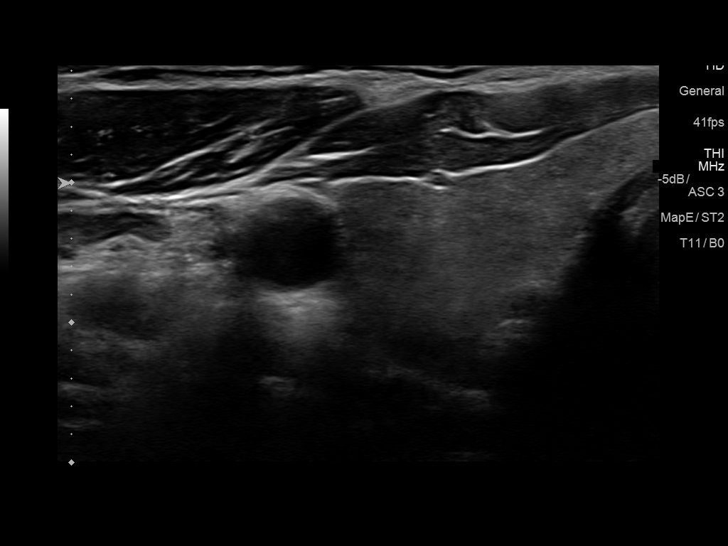
[im 23/56]
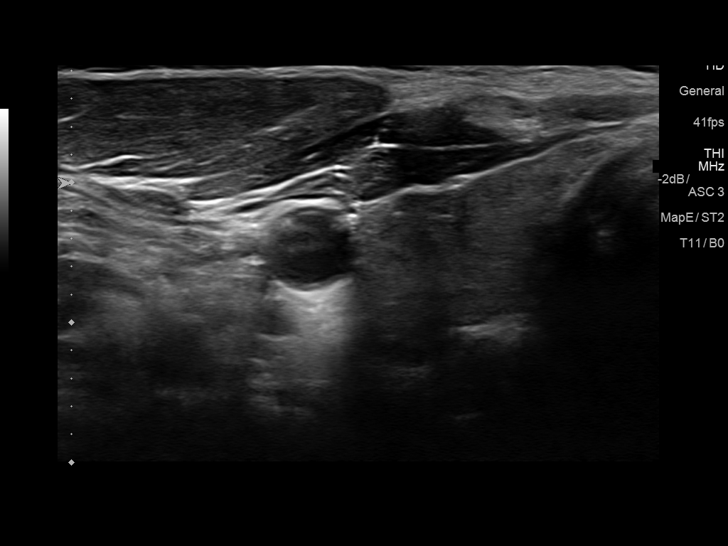
[im 28/56]
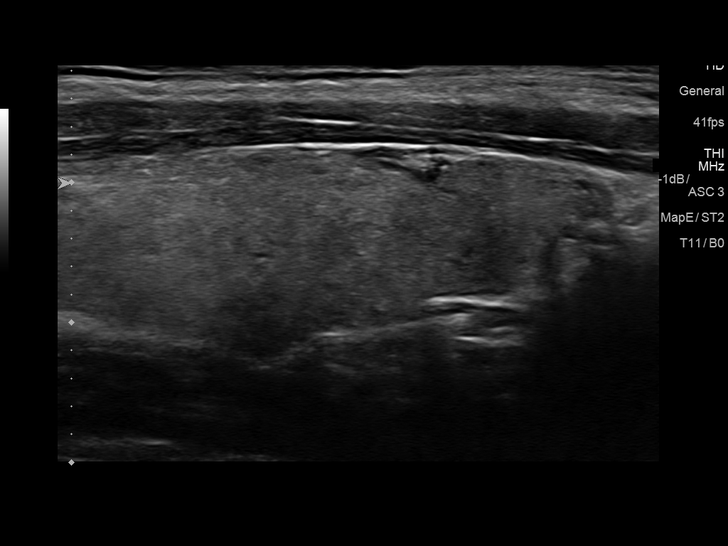
[im 33/56]
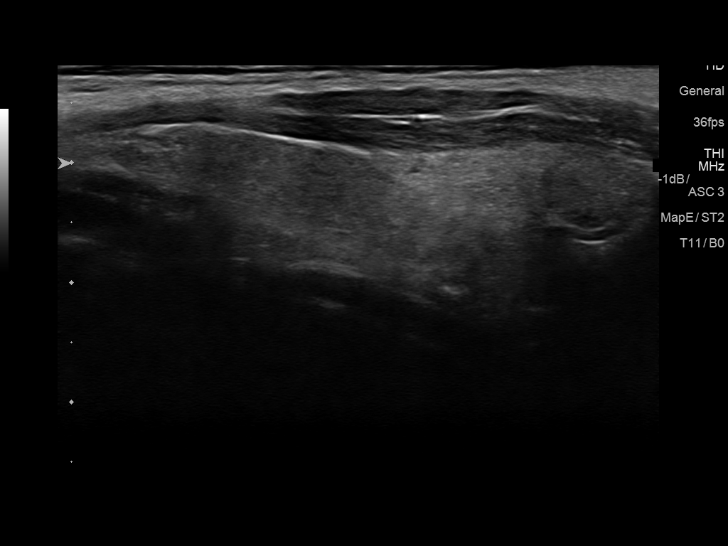
[im 37/56]
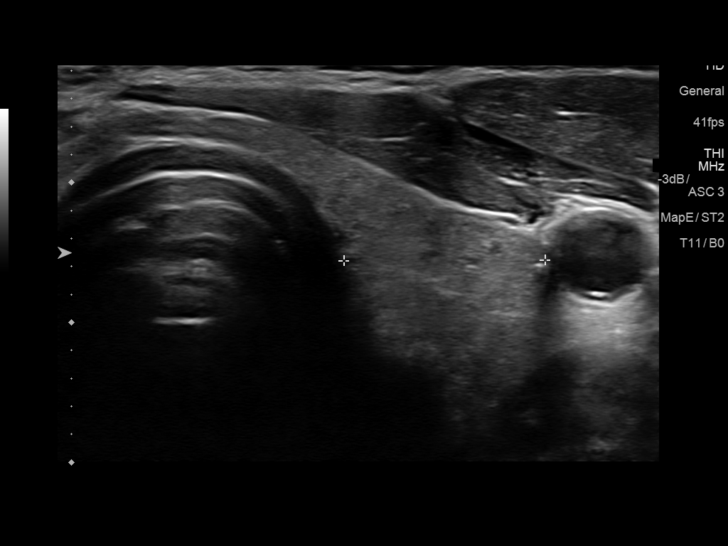
[im 42/56]
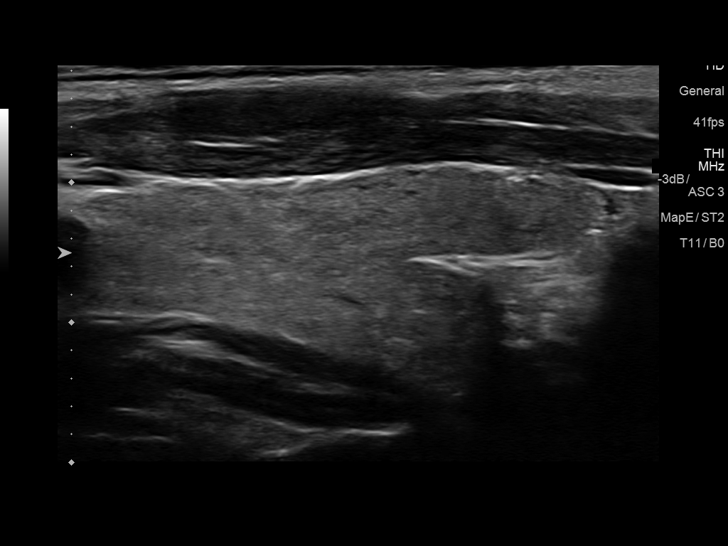
[im 46/56]
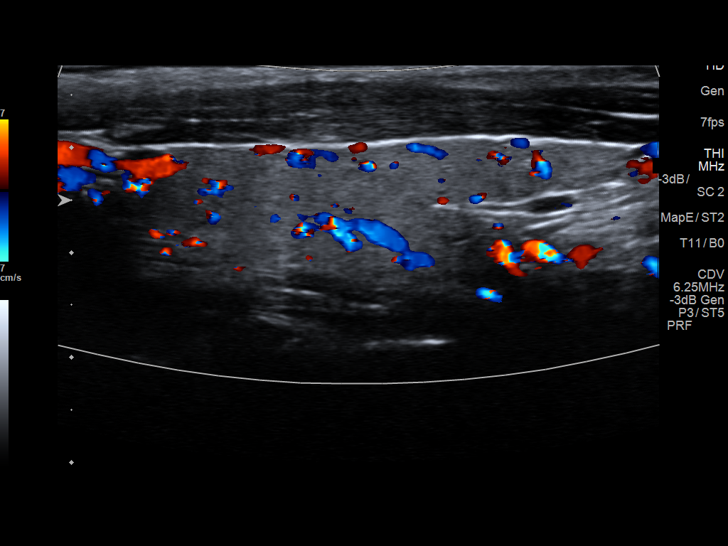
[im 51/56]
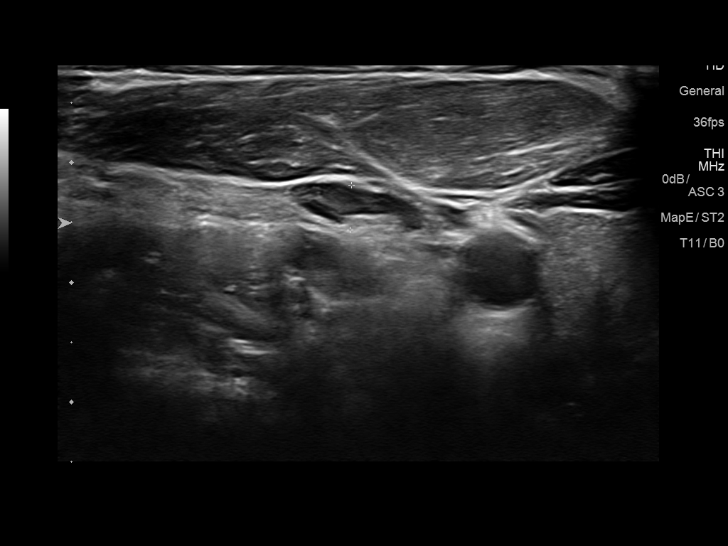
[im 56/56]
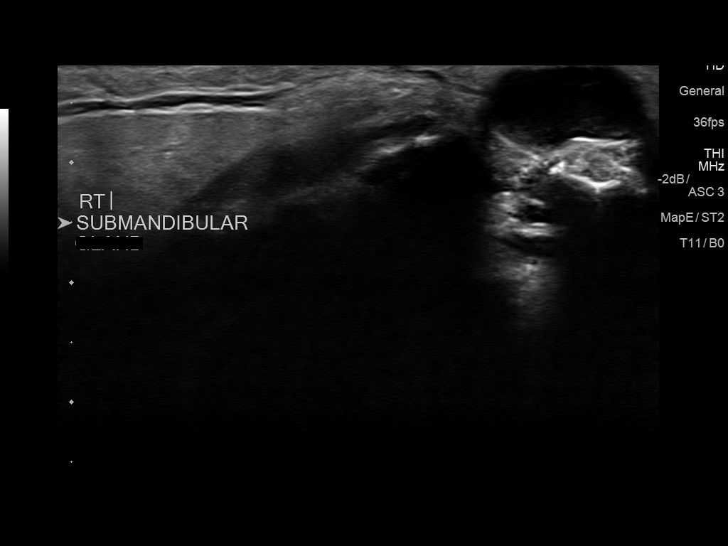

[13 of 25 positions shown; findings below may reference images not displayed]

FINDINGS: Parenchymal Echotexture: Normal

Isthmus: 0.2 cm

Right lobe: 5.7 x 1.3 x 1.3 cm

Left lobe: 4.7 x 1.3 x 1.4 cm

_________________________________________________________

Estimated total number of nodules >/= 1 cm: 0

Number of spongiform nodules >/=  2 cm not described below (TR1): 0

Number of mixed cystic and solid nodules >/= 1.5 cm not described
below (TR2): 0

_________________________________________________________

No discrete nodules are seen within the thyroid gland.

The palpable abnormality is to the right of midline and superior to
the isthmus of the thyroid gland. It is adjacent to the right
submandibular gland. It is a lobulated cystic abnormality measuring
1.0 x 1.8 x 1.3 cm. There is no internal vascularity on color
Doppler imaging.
IMPRESSION: No evidence of thyroid nodule.

The palpable abnormality corresponds to a cystic abnormality
consistent with the given history of thyroglossal duct cyst.

The above is in keeping with the ACR TI-RADS recommendations - [HOSPITAL] 8773;[DATE].

## 2019-03-26 ENCOUNTER — Ambulatory Visit: Payer: Medicaid Other | Admitting: Family Medicine

## 2020-09-30 ENCOUNTER — Other Ambulatory Visit: Payer: Self-pay

## 2020-09-30 ENCOUNTER — Encounter (HOSPITAL_COMMUNITY): Payer: Self-pay

## 2020-09-30 ENCOUNTER — Ambulatory Visit (HOSPITAL_COMMUNITY)
Admission: EM | Admit: 2020-09-30 | Discharge: 2020-09-30 | Disposition: A | Discharge status: Home / Self Care | Payer: Medicaid Other | Attending: Medical Oncology | Admitting: Medical Oncology

## 2020-09-30 DIAGNOSIS — S61412A Laceration without foreign body of left hand, initial encounter: Secondary | ICD-10-CM | POA: Diagnosis not present

## 2020-09-30 DIAGNOSIS — L989 Disorder of the skin and subcutaneous tissue, unspecified: Secondary | ICD-10-CM | POA: Diagnosis not present

## 2020-09-30 DIAGNOSIS — Z23 Encounter for immunization: Secondary | ICD-10-CM | POA: Diagnosis not present

## 2020-09-30 MED ORDER — DOXYCYCLINE HYCLATE 100 MG PO CAPS: 100.0000 mg | ORAL_CAPSULE | Freq: Two times a day (BID) | ORAL | 0 refills | Status: DC

## 2020-09-30 MED ORDER — TETANUS-DIPHTH-ACELL PERTUSSIS 5-2.5-18.5 LF-MCG/0.5 IM SUSY
PREFILLED_SYRINGE | INTRAMUSCULAR | Status: AC
  Filled 2020-09-30: qty 0.5

## 2020-09-30 MED ORDER — TETANUS-DIPHTH-ACELL PERTUSSIS 5-2.5-18.5 LF-MCG/0.5 IM SUSY
0.5000 mL | PREFILLED_SYRINGE | Freq: Once | INTRAMUSCULAR | Status: AC
  Administered 2020-09-30: 0.5 mL via INTRAMUSCULAR

## 2020-09-30 NOTE — ED Provider Notes (Signed)
[facility]    CSN: [id number] Arrival date & time: 09/30/20  1400      History   Chief Complaint Chief Complaint  Patient presents with  . Hand Injury    HPI Paul Morris is a 35 y.o. male.   HPI   Hand Injury: Patient reports that yesterday he accidentally got scraped by a rusty nail.  He believes that his last Tdap vaccine was about 13 years ago so he knows he needs to come in to have this shot updated.  He states that he wash the area with soap and water and it has been healing well.  He denies any risk of a foreign body or debris.  He recalls that he is able to move the fingers and the hand well without any pain and he has not concerned about any fracture.  He has not had any fevers, swelling, neuro sensation changes.   Skin lesion: Patient reports a skin lesion of his right posterior ear.  He states that this has been there for quite a few months.  Initially he recalls mentioning this to his PCP and asked if he should see a dermatologist however he was unsure about that answer given to him by his primary care.  He states that the area has grown in size and has become irritated and painful.  No discharge noted, no bleeding, no hearing changes.  He has not tried anything on this area.  History reviewed. No pertinent past medical history.  Patient Active Problem List   Diagnosis Date Noted  . Chest pain 05/12/2018  . Acne 10/14/2017  . Lymphadenopathy 10/14/2017  . Epidermal cyst 08/29/2017  . Tonsillar cyst 08/29/2017  . Tonsillolith 06/30/2017  . Uvulitis 03/22/2017  . Palpitations 02/03/2017  . GERD (gastroesophageal reflux disease) 11/10/2016  . Cervical lymphadenopathy 08/04/2016  . Schizophrenia (HCC) 08/04/2016  . Tobacco use disorder 08/04/2016  . Depression 08/04/2016  . Generalized anxiety disorder 08/04/2016  . 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 Alcohol use disorder, severe, dependence (HCC) 08/04/2016    Past Surgical  History:  Procedure Laterality Date  . thyroglossal ductal cyst removal Left 2012       Home Medications    Prior to Admission medications   Medication Sig Start Date End Date Taking? Authorizing Provider  citalopram (CELEXA) 40 MG tablet Take 40 mg by mouth daily.    [provider]  paliperidone (INVEGA) 6 MG 24 hr tablet Take 6 mg by mouth daily.    [provider]  pantoprazole (PROTONIX) 20 MG tablet Take 1 tablet (20 mg total) by mouth daily. Patient not taking: Reported on 10/14/2017 06/30/17   [name], DO    Family History Family History  Problem Relation Age of Onset  . Alcohol abuse Mother   . Drug abuse Mother   . Alcohol abuse Father   . Drug abuse Father     Social History Social History   Tobacco Use  . Smoking status: Current Every Day Smoker    Packs/day: 1.00    Types: Cigarettes  . Smokeless tobacco: Never Used  Substance Use Topics  . Alcohol use: Yes  . Drug use: No     Allergies   Amoxicillin   Review of Systems Review of Systems  As stated above in HPI Physical Exam Triage Vital Signs ED Triage Vitals [09/30/20 1551]  Enc Vitals Group     BP 122/80     Pulse Rate 68  Resp 19     Temp 98.4 F (36.9 C)     Temp Source Oral     SpO2 98 %     Weight      Height      Head Circumference      Peak Flow      Pain Score 6     Pain Loc      Pain Edu?      Excl. in GC?    No data found.  Updated Vital Signs BP 122/80 (BP Location: Left Arm)   Pulse 68   Temp 98.4 F (36.9 C) (Oral)   Resp 19   SpO2 98%   Physical Exam Vitals and nursing note reviewed.  Constitutional:      General: He is not in acute distress.    Appearance: Normal appearance. He is not ill-appearing, toxic-appearing or diaphoretic.  Skin:    Comments: There are three 4 mm superficial lacerations of the left hand near third, fourth and fifth proximal fingers that appear well-healed without any edema, erythema or surrounding  tenderness.  There is a blueberry size pedunculated lesion with telangiectasias of the right posterior ear which appears mildly erythematic.  This lesion is soft in nature without any discharge or bleeding.  Neurological:     Mental Status: He is alert.      UC Treatments / Results  Labs (all labs ordered are listed, but only abnormal results are displayed) Labs Reviewed - No data to display  EKG   Radiology No results found.  Procedures Procedures (including critical care time)  Medications Ordered in UC Medications - No data to display  Initial Impression / Assessment and Plan / UC Course  I have reviewed the triage vital signs and the nursing notes.  Pertinent labs & imaging results that were available during my care of the patient were reviewed by me and considered in my medical decision making (see chart for details).     1.  Hand pain starting him on doxycycline and getting him up-to-date with a Tdap vaccine.  Discussed how to take this antibiotic along with common potential side effects and precautions.  Discussed wound care.  He declines x-ray at this time.  2.  New to me.  I discussed with patient that this could represent a cyst versus a potential BCC versus SCC.  I would recommend that we see how the lesion responds to the doxycycline treatment and that he follow-up with his primary care, a dermatologist or an ENT specialist.  I did discuss that he will need further work-up given the telangiectasias and the pedunculated and soft nature of the lesion.  He is agreeable. Final Clinical Impressions(s) / UC Diagnoses   Final diagnoses:  None   Discharge Instructions   None    ED Prescriptions    None     PDMP not reviewed this encounter.   Rushie Chestnut, [place] 09/30/20 1741

## 2020-09-30 NOTE — ED Triage Notes (Signed)
Pt reports a rusted nail cutting him on the left hand. He states he has a lump behind his right ear. Pt states he feels a sharp pain behind his ear and pressure. Pt states he last received his tetanus shots about 13 or more years ago.

## 2020-10-01 ENCOUNTER — Ambulatory Visit (INDEPENDENT_AMBULATORY_CARE_PROVIDER_SITE_OTHER): Payer: Medicaid Other | Admitting: Family Medicine

## 2020-10-01 ENCOUNTER — Encounter: Payer: Self-pay | Admitting: Family Medicine

## 2020-10-01 VITALS — BP 100/70 | HR 89 | Wt 173.0 lb

## 2020-10-01 DIAGNOSIS — Z5320 Procedure and treatment not carried out because of patient's decision for unspecified reasons: Secondary | ICD-10-CM | POA: Diagnosis present

## 2020-10-01 DIAGNOSIS — H9390 Unspecified disorder of ear, unspecified ear: Secondary | ICD-10-CM | POA: Diagnosis not present

## 2020-10-01 DIAGNOSIS — Z1159 Encounter for screening for other viral diseases: Secondary | ICD-10-CM

## 2020-10-01 DIAGNOSIS — H6191 Disorder of right external ear, unspecified: Secondary | ICD-10-CM | POA: Diagnosis not present

## 2020-10-01 NOTE — Progress Notes (Signed)
    SUBJECTIVE:   CHIEF COMPLAINT / HPI:   Ear lesion He was recently seen in urgent care for a skin laceration.  At that visit, he was told by the provider the mass behind his right ear is concerning for basal cell carcinoma.  He reports that this mass has been behind his ear for several years now, since before COVID.  He notes that at one point, he thought it had come to ahead and he tried to squeeze and pop it thinking maybe it was a zit or something.  It now seems to have healed back in filled in without any obvious head or central follicle.  He was not particularly concerned about this ear lesion until he was told it could be cancer.  PERTINENT  PMH / PSH: Noncontributory  OBJECTIVE:   BP 100/70   Pulse 89   Wt 173 lb (78.5 kg)   SpO2 96%   BMI 22.21 kg/m     Media Information         Document Information  Photos    10/01/2020 11:00  Attached To:  Office Visit on 10/01/20 with Mirian Mo, MD   Source Information  Mirian Mo, MD  [facility]    Media Information         Document Information  Photos    10/01/2020 11:00  Attached To:  Office Visit on 10/01/20 with Mirian Mo, MD   Source Information  Mirian Mo, MD  [facility]     ASSESSMENT/PLAN:   Ear lesion The differential includes sebaceous cyst versus lipoma.  Based on the physical appearance and palpation, I have a very low suspicion for basal cell carcinoma.  There is no evidence of ulceration or concerning coloration.  [name] is provided reassurance that we have a low suspicion for skin cancer at this time but we will move forward with a full work-up to be perfectly confident this is not cancer. -Referral to plastic surgery for biopsy -He was encouraged to call the clinic if he would not be able to be seen by plastic surgery for many months as he would like an answer sooner than that.   Healthcare maintenance -Follow-up hep C  Mirian Mo, MD [facility]  [facility]

## 2020-10-01 NOTE — Patient Instructions (Signed)
It was great to see you today.  Here is a quick review of the things we talked about:  Ear mass: I discussed this with my attending who agrees that this is not cancer.  This is more likely related to previous trauma or may be ear piercing.  I do think it is very reasonable to send you to a different doctor to have it biopsied to be 100% sure it is not cancer. I have placed a referral to plastic surgery you should get a call in the next 1-2 weeks to set up your appointment.  Please let me know if you have not received a call in the next 2 weeks.  Screening for hepatitis C: If all of your labs are normal, I will send you a message over my chart or send you a letter.  If there is anything to discuss, I will give you a phone call.

## 2020-10-01 NOTE — Assessment & Plan Note (Signed)
The differential includes sebaceous cyst versus lipoma.  Based on the physical appearance and palpation, I have a very low suspicion for basal cell carcinoma.  There is no evidence of ulceration or concerning coloration.  Paul Morris is provided reassurance that we have a low suspicion for skin cancer at this time but we will move forward with a full work-up to be perfectly confident this is not cancer. -Referral to plastic surgery for biopsy -He was encouraged to call the clinic if he would not be able to be seen by plastic surgery for many months as he would like an answer sooner than that.

## 2020-10-02 LAB — HEPATITIS C ANTIBODY: Hep C Virus Ab: <0.1 s/co ratio (ref 0.0–0.9)

## 2020-10-03 ENCOUNTER — Encounter: Payer: Self-pay | Admitting: Family Medicine

## 2020-10-15 ENCOUNTER — Other Ambulatory Visit: Payer: Self-pay

## 2020-10-15 ENCOUNTER — Encounter: Payer: Self-pay | Admitting: Family Medicine

## 2020-10-15 ENCOUNTER — Ambulatory Visit (INDEPENDENT_AMBULATORY_CARE_PROVIDER_SITE_OTHER): Payer: Medicaid Other | Admitting: Family Medicine

## 2020-10-15 VITALS — BP 123/80 | HR 88 | Ht 74.0 in | Wt 175.4 lb

## 2020-10-15 DIAGNOSIS — H9390 Unspecified disorder of ear, unspecified ear: Secondary | ICD-10-CM

## 2020-10-15 DIAGNOSIS — Z23 Encounter for immunization: Secondary | ICD-10-CM | POA: Diagnosis not present

## 2020-10-15 NOTE — Assessment & Plan Note (Signed)
It seems that this ear lesion seems to have spontaneously ruptured with a thick white/yellow drainage.  There is no evidence of infection at this time.  No concern for cellulitis.  He was again reassured this does not appear to be a basal cell carcinoma.  He was encouraged to keep his appointment with plastic surgery so that a definitive biopsy could be done to rule out basal cell carcinoma.

## 2020-10-15 NOTE — Progress Notes (Signed)
    SUBJECTIVE:   CHIEF COMPLAINT / HPI:   Right earlobe cyst Paul Morris reports that, since her last visit, he has noticed a little bit of ear pain.  He ended up manipulating the cyst in the back of his ear and it ended up spontaneously draining several days ago.  He reports thick white/yellow drainage and now a significant reduction in the bump behind his right ear.  This area behind his ear is not currently painful and seems to be recovering nicely.  He wonders if maybe this was an ingrown hair the whole time.  He currently has an appointment with plastic surgery on 7/6 for biopsy of this lesion.  PERTINENT  PMH / PSH: Schizophrenia  OBJECTIVE:   BP 123/80   Pulse 88   Ht 6\' 2"  (1.88 m)   Wt 175 lb 6.4 oz (79.6 kg)   SpO2 95%   BMI 22.52 kg/m    General: Alert and interactive.  No acute distress Respiratory: Breathing comfortably on room air.  No respiratory distress.   Media Information         Document Information  Photos    10/15/2020 10:49  Attached To:  Office Visit on 10/15/20 with 10/17/20, MD   Source Information  Mirian Mo, MD  [facility]    ASSESSMENT/PLAN:   Ear lesion It seems that this ear lesion seems to have spontaneously ruptured with a thick white/yellow drainage.  There is no evidence of infection at this time.  No concern for cellulitis.  He was again reassured this does not appear to be a basal cell carcinoma.  He was encouraged to keep his appointment with plastic surgery so that a definitive biopsy could be done to rule out basal cell carcinoma.     Mirian Mo, MD [facility]

## 2020-11-05 ENCOUNTER — Encounter: Payer: Self-pay | Admitting: Plastic Surgery

## 2020-11-05 ENCOUNTER — Other Ambulatory Visit: Payer: Self-pay

## 2020-11-05 ENCOUNTER — Ambulatory Visit (INDEPENDENT_AMBULATORY_CARE_PROVIDER_SITE_OTHER): Payer: Medicaid Other | Admitting: Plastic Surgery

## 2020-11-05 VITALS — BP 117/77 | HR 76 | Ht 70.0 in | Wt 174.0 lb

## 2020-11-05 DIAGNOSIS — L989 Disorder of the skin and subcutaneous tissue, unspecified: Secondary | ICD-10-CM | POA: Diagnosis not present

## 2020-11-05 NOTE — Progress Notes (Signed)
   Referring Provider Ronnald Ramp, MD [address] [place],  [place] [address]   CC:  Chief Complaint  Patient presents with   Advice Only      Paul Morris is an 35 y.o. male.  HPI: Patient presents to discuss a lesion on the posterior aspect of his right ear.  Is been present for 2 years.  Recently it did grow in size and then burst to drain some purulent fluid.  It has since subsided and gotten a bit smaller but he is interested in having it removed.    Outpatient Encounter Medications as of 11/05/2020  Medication Sig   citalopram (CELEXA) 40 MG tablet Take 40 mg by mouth daily.   paliperidone (INVEGA) 6 MG 24 hr tablet Take 9 mg by mouth daily.   pantoprazole (PROTONIX) 20 MG tablet Take 1 tablet (20 mg total) by mouth daily.   No facility-administered encounter medications on file as of 11/05/2020.     History reviewed. No pertinent past medical history.  Past Surgical History:  Procedure Laterality Date   thyroglossal ductal cyst removal Left 2012    Family History  Problem Relation Age of Onset   Alcohol abuse Mother    Drug abuse Mother    Alcohol abuse Father    Drug abuse Father     Social History   Social History Narrative   Not on file     Review of Systems General: Denies fevers, chills, weight loss CV: Denies chest pain, shortness of breath, palpitations  Physical Exam Vitals with BMI 11/05/2020 10/15/2020 10/01/2020  Height 5\' 10"  6\' 2"  -  Weight 174 lbs 175 lbs 6 oz 173 lbs  BMI 24.97 22.51 -  Systolic 117 123  Diastolic 77 80 70  Pulse 76 88 89    General:  No acute distress,  Alert and oriented, Non-Toxic, Normal speech and affect Posterior to the right ear right in the sulcus is a cystic appearing lesion with a central punctum.  Minimal surrounding inflammation or erythema at this point no drainage today on exam.  Looks to be about 1.5 to 2 cm in size.  Assessment/Plan Patient presents with a symptomatic cystic lesion in  the posterior auricular sulcus behind the right ear.  We discussed excision.  We discussed the risks include bleeding, infection, damage to surrounding structures need for additional procedures.  All of his questions were answered and plan to move forward.  11/05/2020, 5:12 PM

## 2020-11-17 ENCOUNTER — Other Ambulatory Visit (HOSPITAL_COMMUNITY)
Admission: RE | Admit: 2020-11-17 | Discharge: 2020-11-17 | Disposition: A | Discharge status: Home / Self Care | Payer: Medicaid Other | Source: Ambulatory Visit | Attending: Plastic Surgery | Admitting: Plastic Surgery

## 2020-11-17 ENCOUNTER — Other Ambulatory Visit: Payer: Self-pay

## 2020-11-17 ENCOUNTER — Ambulatory Visit (INDEPENDENT_AMBULATORY_CARE_PROVIDER_SITE_OTHER): Payer: Medicaid Other | Admitting: Plastic Surgery

## 2020-11-17 VITALS — BP 119/70 | HR 96

## 2020-11-17 DIAGNOSIS — L989 Disorder of the skin and subcutaneous tissue, unspecified: Secondary | ICD-10-CM | POA: Diagnosis not present

## 2020-11-17 NOTE — Progress Notes (Signed)
Operative Note   DATE OF OPERATION: 11/17/2020  LOCATION:    SURGICAL DEPARTMENT: Plastic Surgery  PREOPERATIVE DIAGNOSES: Right posterior ear cyst  POSTOPERATIVE DIAGNOSES:  same  PROCEDURE:  Excision of right posterior ear cyst measuring 2.5 cm Complex closure measuring 2.5 cm  SURGEON: Ancil Linsey, MD  ANESTHESIA:  Local  COMPLICATIONS: None.   INDICATIONS FOR PROCEDURE:  The patient, Paul Morris is a 35 y.o. male born on 1985-10-03, is here for treatment of symptomatic right posterior ear cyst MRN: [id number]  CONSENT:  Informed consent was obtained directly from the patient. Risks, benefits and alternatives were fully discussed. Specific risks including but not limited to bleeding, infection, hematoma, seroma, scarring, pain, infection, wound healing problems, and need for further surgery were all discussed. The patient did have an ample opportunity to have questions answered to satisfaction.   DESCRIPTION OF PROCEDURE:  Local anesthesia was administered. The patient's operative site was prepped and draped in a sterile fashion. A time out was performed and all information was confirmed to be correct.  The lesion was excised with a 15 blade.  Hemostasis was obtained.  Circumferential undermining was performed and the skin was advanced and closed in layers with interrupted buried Monocryl sutures and 4-0 Monocryl for the skin.  The lesion excised measured 2.5 cm, and the total length of closure measured 2.5 cm.    The patient tolerated the procedure well.  There were no complications.

## 2020-11-18 LAB — SURGICAL PATHOLOGY

## 2021-10-06 ENCOUNTER — Encounter: Payer: Self-pay | Admitting: *Deleted
